# Patient Record
Sex: Female | Born: 1965 | Race: White | Hispanic: No | Marital: Married | Smoking: Never smoker
Health system: Southern US, Community
[De-identification: ages and names within clinical notes are randomized; demographics above are authoritative.]

## PROBLEM LIST (undated history)

## (undated) ENCOUNTER — Emergency Department (HOSPITAL_BASED_OUTPATIENT_CLINIC_OR_DEPARTMENT_OTHER): Payer: BC Managed Care – PPO

## (undated) DIAGNOSIS — R1032 Left lower quadrant pain: Secondary | ICD-10-CM

## (undated) DIAGNOSIS — T7840XA Allergy, unspecified, initial encounter: Secondary | ICD-10-CM

## (undated) DIAGNOSIS — IMO0001 Reserved for inherently not codable concepts without codable children: Secondary | ICD-10-CM

## (undated) DIAGNOSIS — F419 Anxiety disorder, unspecified: Secondary | ICD-10-CM

## (undated) DIAGNOSIS — R896 Abnormal cytological findings in specimens from other organs, systems and tissues: Secondary | ICD-10-CM

## (undated) HISTORY — DX: Allergy, unspecified, initial encounter: T78.40XA

## (undated) HISTORY — DX: Left lower quadrant pain: R10.32

## (undated) HISTORY — DX: Reserved for inherently not codable concepts without codable children: IMO0001

## (undated) HISTORY — PX: NO PAST SURGERIES: SHX2092

## (undated) HISTORY — DX: Anxiety disorder, unspecified: F41.9

## (undated) HISTORY — DX: Abnormal cytological findings in specimens from other organs, systems and tissues: R89.6

---

## 1997-11-27 ENCOUNTER — Inpatient Hospital Stay (HOSPITAL_COMMUNITY): Admission: AD | Admit: 1997-11-27 | Discharge: 1997-11-29 | Payer: Self-pay | Admitting: Obstetrics and Gynecology

## 1997-12-07 ENCOUNTER — Encounter (HOSPITAL_COMMUNITY): Admission: RE | Admit: 1997-12-07 | Discharge: 1998-03-07 | Payer: Self-pay | Admitting: Unknown Physician Specialty

## 1998-01-09 ENCOUNTER — Other Ambulatory Visit: Admission: RE | Admit: 1998-01-09 | Discharge: 1998-01-09 | Payer: Self-pay | Admitting: Obstetrics and Gynecology

## 1998-04-12 ENCOUNTER — Other Ambulatory Visit: Admission: RE | Admit: 1998-04-12 | Discharge: 1998-04-12 | Payer: Self-pay | Admitting: Obstetrics and Gynecology

## 1999-02-15 ENCOUNTER — Other Ambulatory Visit: Admission: RE | Admit: 1999-02-15 | Discharge: 1999-02-15 | Payer: Self-pay | Admitting: Obstetrics and Gynecology

## 1999-03-20 ENCOUNTER — Encounter: Admission: RE | Admit: 1999-03-20 | Discharge: 1999-06-18 | Payer: Self-pay | Admitting: Obstetrics and Gynecology

## 1999-08-05 ENCOUNTER — Encounter: Admission: RE | Admit: 1999-08-05 | Discharge: 1999-11-03 | Payer: Self-pay | Admitting: Obstetrics and Gynecology

## 2000-10-20 ENCOUNTER — Emergency Department (HOSPITAL_COMMUNITY): Admission: EM | Admit: 2000-10-20 | Discharge: 2000-10-20 | Payer: Self-pay | Admitting: Emergency Medicine

## 2000-10-23 ENCOUNTER — Encounter (HOSPITAL_COMMUNITY): Admission: RE | Admit: 2000-10-23 | Discharge: 2001-01-21 | Payer: Self-pay | Admitting: Emergency Medicine

## 2001-12-13 ENCOUNTER — Other Ambulatory Visit: Admission: RE | Admit: 2001-12-13 | Discharge: 2001-12-13 | Payer: Self-pay | Admitting: Obstetrics and Gynecology

## 2003-03-16 ENCOUNTER — Other Ambulatory Visit: Admission: RE | Admit: 2003-03-16 | Discharge: 2003-03-16 | Payer: Self-pay | Admitting: Obstetrics and Gynecology

## 2004-05-20 ENCOUNTER — Other Ambulatory Visit: Admission: RE | Admit: 2004-05-20 | Discharge: 2004-05-20 | Payer: Self-pay | Admitting: Obstetrics and Gynecology

## 2004-05-22 ENCOUNTER — Encounter: Admission: RE | Admit: 2004-05-22 | Discharge: 2004-05-22 | Payer: Self-pay | Admitting: Obstetrics and Gynecology

## 2005-05-22 ENCOUNTER — Other Ambulatory Visit: Admission: RE | Admit: 2005-05-22 | Discharge: 2005-05-22 | Payer: Self-pay | Admitting: Obstetrics and Gynecology

## 2005-06-16 ENCOUNTER — Encounter: Admission: RE | Admit: 2005-06-16 | Discharge: 2005-06-16 | Payer: Self-pay | Admitting: Obstetrics and Gynecology

## 2007-05-19 ENCOUNTER — Encounter: Admission: RE | Admit: 2007-05-19 | Discharge: 2007-05-19 | Payer: Self-pay | Admitting: Obstetrics and Gynecology

## 2008-05-22 ENCOUNTER — Encounter: Admission: RE | Admit: 2008-05-22 | Discharge: 2008-05-22 | Payer: Self-pay | Admitting: Obstetrics and Gynecology

## 2009-05-24 ENCOUNTER — Encounter: Admission: RE | Admit: 2009-05-24 | Discharge: 2009-05-24 | Payer: Self-pay | Admitting: Obstetrics and Gynecology

## 2010-04-29 ENCOUNTER — Other Ambulatory Visit: Payer: Self-pay | Admitting: Obstetrics and Gynecology

## 2010-04-29 DIAGNOSIS — Z1231 Encounter for screening mammogram for malignant neoplasm of breast: Secondary | ICD-10-CM

## 2010-05-28 ENCOUNTER — Ambulatory Visit
Admission: RE | Admit: 2010-05-28 | Discharge: 2010-05-28 | Disposition: A | Discharge status: Home / Self Care | Payer: BC Managed Care – PPO | Source: Ambulatory Visit | Attending: Obstetrics and Gynecology | Admitting: Obstetrics and Gynecology

## 2010-05-28 DIAGNOSIS — Z1231 Encounter for screening mammogram for malignant neoplasm of breast: Secondary | ICD-10-CM

## 2010-05-31 ENCOUNTER — Other Ambulatory Visit: Payer: Self-pay | Admitting: Obstetrics and Gynecology

## 2010-05-31 DIAGNOSIS — R928 Other abnormal and inconclusive findings on diagnostic imaging of breast: Secondary | ICD-10-CM

## 2010-06-04 ENCOUNTER — Ambulatory Visit
Admission: RE | Admit: 2010-06-04 | Discharge: 2010-06-04 | Disposition: A | Discharge status: Home / Self Care | Payer: BC Managed Care – PPO | Source: Ambulatory Visit | Attending: Obstetrics and Gynecology | Admitting: Obstetrics and Gynecology

## 2010-06-04 DIAGNOSIS — R928 Other abnormal and inconclusive findings on diagnostic imaging of breast: Secondary | ICD-10-CM

## 2011-05-05 ENCOUNTER — Other Ambulatory Visit: Payer: Self-pay | Admitting: Family Medicine

## 2011-05-06 ENCOUNTER — Other Ambulatory Visit: Payer: Self-pay | Admitting: Obstetrics and Gynecology

## 2011-05-06 DIAGNOSIS — Z1231 Encounter for screening mammogram for malignant neoplasm of breast: Secondary | ICD-10-CM

## 2011-06-10 ENCOUNTER — Ambulatory Visit
Admission: RE | Admit: 2011-06-10 | Discharge: 2011-06-10 | Disposition: A | Discharge status: Home / Self Care | Payer: BC Managed Care – PPO | Source: Ambulatory Visit | Attending: Obstetrics and Gynecology | Admitting: Obstetrics and Gynecology

## 2011-06-10 DIAGNOSIS — Z1231 Encounter for screening mammogram for malignant neoplasm of breast: Secondary | ICD-10-CM

## 2011-07-11 DIAGNOSIS — IMO0001 Reserved for inherently not codable concepts without codable children: Secondary | ICD-10-CM | POA: Insufficient documentation

## 2011-07-17 ENCOUNTER — Ambulatory Visit: Payer: Self-pay | Admitting: Obstetrics and Gynecology

## 2011-08-14 ENCOUNTER — Other Ambulatory Visit: Payer: Self-pay | Admitting: Obstetrics and Gynecology

## 2011-08-14 MED ORDER — ETONOGESTREL-ETHINYL ESTRADIOL 0.12-0.015 MG/24HR VA RING: VAGINAL_RING | VAGINAL | Status: DC

## 2011-08-14 NOTE — Telephone Encounter (Signed)
Laura/SR pt. °

## 2011-08-14 NOTE — Telephone Encounter (Signed)
Refill of nuvaring #1 sent to [facility] to last until AEX in July.  Pt notified.  ld

## 2011-08-27 ENCOUNTER — Ambulatory Visit: Payer: Self-pay | Admitting: Obstetrics and Gynecology

## 2011-09-08 ENCOUNTER — Encounter: Payer: Self-pay | Admitting: Obstetrics and Gynecology

## 2011-09-08 ENCOUNTER — Ambulatory Visit (INDEPENDENT_AMBULATORY_CARE_PROVIDER_SITE_OTHER): Payer: BC Managed Care – PPO | Admitting: Obstetrics and Gynecology

## 2011-09-08 VITALS — BP 120/74 | Ht 65.0 in | Wt 197.0 lb

## 2011-09-08 DIAGNOSIS — Z124 Encounter for screening for malignant neoplasm of cervix: Secondary | ICD-10-CM

## 2011-09-08 DIAGNOSIS — Z01419 Encounter for gynecological examination (general) (routine) without abnormal findings: Secondary | ICD-10-CM

## 2011-09-08 MED ORDER — ETONOGESTREL-ETHINYL ESTRADIOL 0.12-0.015 MG/24HR VA RING: VAGINAL_RING | VAGINAL | Status: DC

## 2011-09-08 NOTE — Progress Notes (Signed)
The patient reports:normal menses, no abnormal bleeding, pelvic pain or discharge  Contraception:NuvaRing vaginal inserts  Last mammogram: was normal April 2013 Last pap: was normal June  2012  GC/Chlamydia cultures offered: declined HIV/RPR/HbsAg offered:  declined HSV 1 and 2 glycoprotein offered: declined  Menstrual cycle regular and monthly: Yes Menstrual flow normal: Yes very light   Urinary symptoms: none Normal bowel movements: Yes Reports abuse at home: No  Subjective:    Marissa Marshall is a 46 y.o. female, G3P2, who presents for an annual exam. Nuvaring: satisfied    History   Social History  . Marital Status: Married    Spouse Name: N/A    Number of Children: N/A  . Years of Education: N/A   Social History Main Topics  . Smoking status: Never Smoker   . Smokeless tobacco: Never Used  . Alcohol Use: No  . Drug Use: No  . Sexually Active: Yes    Birth Control/ Protection: Inserts     Nuvaring   Other Topics Concern  . None   Social History Narrative  . None    Menstrual cycle:   LMP: Patient's last menstrual period was 08/15/2011.           Cycle: normal and light  The following portions of the patient's history were reviewed and updated as appropriate: allergies, current medications, past family history, past medical history, past social history, past surgical history and problem list.  Review of Systems Pertinent items are noted in HPI. Breast:Negative for breast lump,nipple discharge or nipple retraction Gastrointestinal: Negative for abdominal pain, change in bowel habits or rectal bleeding Urinary:negative   Objective:    BP 120/74  Ht 5\' 5"  (1.651 m)  Wt 197 lb (89.359 kg)  BMI 32.78 kg/m2  LMP 08/15/2011    Weight:  Wt Readings from Last 1 Encounters:  09/08/11 197 lb (89.359 kg)          BMI: Body mass index is 32.78 kg/(m^2).  General Appearance: Alert, appropriate appearance for age. No acute distress HEENT: Grossly normal Neck /  Thyroid: Supple, no masses, nodes or enlargement Lungs: clear to auscultation bilaterally Back: No CVA tenderness Breast Exam: No masses or nodes.No dimpling, nipple retraction or discharge. Cardiovascular: Regular rate and rhythm. S1, S2, no murmur Gastrointestinal: Soft, non-tender, no masses or organomegaly Pelvic Exam: Vulva and vagina appear normal. Bimanual exam reveals normal uterus and adnexa. Rectovaginal: normal rectal, no masses Lymphatic Exam: Non-palpable nodes in neck, clavicular, axillary, or inguinal regions Skin: no rash or abnormalities Neurologic: Normal gait and speech, no tremor  Psychiatric: Alert and oriented, appropriate affect.     Assessment:    Normal gyn exam Contraceptive management    Plan:    mammogram pap smear return annually or prn STD screening: declined Contraception:Nuvaring      Marissa Marshall AMD

## 2011-09-10 LAB — PAP IG W/ RFLX HPV ASCU

## 2012-05-06 ENCOUNTER — Other Ambulatory Visit: Payer: Self-pay

## 2012-05-06 DIAGNOSIS — Z1231 Encounter for screening mammogram for malignant neoplasm of breast: Secondary | ICD-10-CM

## 2012-06-10 ENCOUNTER — Ambulatory Visit
Admission: RE | Admit: 2012-06-10 | Discharge: 2012-06-10 | Disposition: A | Discharge status: Home / Self Care | Payer: BC Managed Care – PPO | Source: Ambulatory Visit

## 2012-06-10 DIAGNOSIS — Z1231 Encounter for screening mammogram for malignant neoplasm of breast: Secondary | ICD-10-CM

## 2013-05-03 ENCOUNTER — Other Ambulatory Visit: Payer: Self-pay

## 2013-05-03 DIAGNOSIS — Z1231 Encounter for screening mammogram for malignant neoplasm of breast: Secondary | ICD-10-CM

## 2013-05-30 ENCOUNTER — Other Ambulatory Visit: Payer: Self-pay | Admitting: Family Medicine

## 2013-05-30 DIAGNOSIS — R1032 Left lower quadrant pain: Secondary | ICD-10-CM

## 2013-06-02 ENCOUNTER — Ambulatory Visit
Admission: RE | Admit: 2013-06-02 | Discharge: 2013-06-02 | Disposition: A | Discharge status: Home / Self Care | Payer: BC Managed Care – PPO | Source: Ambulatory Visit | Attending: Family Medicine | Admitting: Family Medicine

## 2013-06-02 DIAGNOSIS — R1032 Left lower quadrant pain: Secondary | ICD-10-CM

## 2013-06-02 MED ORDER — IOHEXOL 300 MG/ML  SOLN
100.0000 mL | Freq: Once | INTRAMUSCULAR | Status: AC | PRN
  Administered 2013-06-02: 100 mL via INTRAVENOUS

## 2013-06-13 ENCOUNTER — Ambulatory Visit
Admission: RE | Admit: 2013-06-13 | Discharge: 2013-06-13 | Disposition: A | Discharge status: Home / Self Care | Payer: BC Managed Care – PPO | Source: Ambulatory Visit

## 2013-06-13 DIAGNOSIS — Z1231 Encounter for screening mammogram for malignant neoplasm of breast: Secondary | ICD-10-CM

## 2013-09-23 ENCOUNTER — Encounter: Payer: Self-pay | Admitting: *Deleted

## 2013-09-23 ENCOUNTER — Encounter: Payer: Self-pay | Admitting: Cardiology

## 2013-09-23 DIAGNOSIS — T7840XA Allergy, unspecified, initial encounter: Secondary | ICD-10-CM | POA: Insufficient documentation

## 2014-01-02 ENCOUNTER — Encounter: Payer: Self-pay | Admitting: *Deleted

## 2014-05-09 ENCOUNTER — Other Ambulatory Visit: Payer: Self-pay

## 2014-05-09 DIAGNOSIS — Z1231 Encounter for screening mammogram for malignant neoplasm of breast: Secondary | ICD-10-CM

## 2014-06-20 ENCOUNTER — Encounter (INDEPENDENT_AMBULATORY_CARE_PROVIDER_SITE_OTHER): Payer: Self-pay

## 2014-06-20 ENCOUNTER — Ambulatory Visit
Admission: RE | Admit: 2014-06-20 | Discharge: 2014-06-20 | Disposition: A | Discharge status: Home / Self Care | Payer: BLUE CROSS/BLUE SHIELD | Source: Ambulatory Visit

## 2014-06-20 ENCOUNTER — Other Ambulatory Visit: Payer: Self-pay

## 2014-06-20 DIAGNOSIS — Z1231 Encounter for screening mammogram for malignant neoplasm of breast: Secondary | ICD-10-CM

## 2015-05-15 ENCOUNTER — Other Ambulatory Visit: Payer: Self-pay

## 2015-05-15 DIAGNOSIS — Z1231 Encounter for screening mammogram for malignant neoplasm of breast: Secondary | ICD-10-CM

## 2015-06-22 ENCOUNTER — Ambulatory Visit
Admission: RE | Admit: 2015-06-22 | Discharge: 2015-06-22 | Disposition: A | Discharge status: Home / Self Care | Payer: BLUE CROSS/BLUE SHIELD | Source: Ambulatory Visit

## 2015-06-22 DIAGNOSIS — Z1231 Encounter for screening mammogram for malignant neoplasm of breast: Secondary | ICD-10-CM

## 2016-03-06 DIAGNOSIS — T7840XA Allergy, unspecified, initial encounter: Secondary | ICD-10-CM | POA: Insufficient documentation

## 2016-05-20 ENCOUNTER — Other Ambulatory Visit: Payer: Self-pay | Admitting: Obstetrics and Gynecology

## 2016-05-20 DIAGNOSIS — Z1231 Encounter for screening mammogram for malignant neoplasm of breast: Secondary | ICD-10-CM

## 2016-07-02 ENCOUNTER — Ambulatory Visit
Admission: RE | Admit: 2016-07-02 | Discharge: 2016-07-02 | Disposition: A | Discharge status: Home / Self Care | Payer: PRIVATE HEALTH INSURANCE | Source: Ambulatory Visit | Attending: Obstetrics and Gynecology | Admitting: Obstetrics and Gynecology

## 2016-07-02 DIAGNOSIS — Z1231 Encounter for screening mammogram for malignant neoplasm of breast: Secondary | ICD-10-CM

## 2017-05-27 ENCOUNTER — Other Ambulatory Visit: Payer: Self-pay | Admitting: Obstetrics and Gynecology

## 2017-05-27 DIAGNOSIS — Z1231 Encounter for screening mammogram for malignant neoplasm of breast: Secondary | ICD-10-CM

## 2017-07-08 ENCOUNTER — Ambulatory Visit
Admission: RE | Admit: 2017-07-08 | Discharge: 2017-07-08 | Disposition: A | Discharge status: Home / Self Care | Payer: PRIVATE HEALTH INSURANCE | Source: Ambulatory Visit | Attending: Obstetrics and Gynecology | Admitting: Obstetrics and Gynecology

## 2017-07-08 DIAGNOSIS — Z1231 Encounter for screening mammogram for malignant neoplasm of breast: Secondary | ICD-10-CM

## 2017-09-30 DIAGNOSIS — H9311 Tinnitus, right ear: Secondary | ICD-10-CM | POA: Insufficient documentation

## 2017-09-30 DIAGNOSIS — H903 Sensorineural hearing loss, bilateral: Secondary | ICD-10-CM | POA: Insufficient documentation

## 2018-04-01 ENCOUNTER — Encounter: Payer: PRIVATE HEALTH INSURANCE | Admitting: Obstetrics & Gynecology

## 2018-06-04 ENCOUNTER — Other Ambulatory Visit: Payer: Self-pay | Admitting: Family Medicine

## 2018-06-04 DIAGNOSIS — Z1231 Encounter for screening mammogram for malignant neoplasm of breast: Secondary | ICD-10-CM

## 2018-08-04 ENCOUNTER — Ambulatory Visit: Payer: PRIVATE HEALTH INSURANCE

## 2018-09-09 ENCOUNTER — Ambulatory Visit
Admission: RE | Admit: 2018-09-09 | Discharge: 2018-09-09 | Disposition: A | Discharge status: Home / Self Care | Payer: PRIVATE HEALTH INSURANCE | Source: Ambulatory Visit | Attending: Family Medicine | Admitting: Family Medicine

## 2018-09-09 DIAGNOSIS — Z1231 Encounter for screening mammogram for malignant neoplasm of breast: Secondary | ICD-10-CM

## 2019-08-03 ENCOUNTER — Other Ambulatory Visit: Payer: Self-pay | Admitting: Family Medicine

## 2019-08-03 DIAGNOSIS — Z1231 Encounter for screening mammogram for malignant neoplasm of breast: Secondary | ICD-10-CM

## 2019-09-14 ENCOUNTER — Ambulatory Visit
Admission: RE | Admit: 2019-09-14 | Discharge: 2019-09-14 | Disposition: A | Discharge status: Home / Self Care | Payer: PRIVATE HEALTH INSURANCE | Source: Ambulatory Visit | Attending: Family Medicine | Admitting: Family Medicine

## 2019-09-14 ENCOUNTER — Other Ambulatory Visit: Payer: Self-pay

## 2019-09-14 DIAGNOSIS — Z1231 Encounter for screening mammogram for malignant neoplasm of breast: Secondary | ICD-10-CM

## 2020-01-10 ENCOUNTER — Other Ambulatory Visit: Payer: Self-pay | Admitting: Family Medicine

## 2020-01-10 DIAGNOSIS — R1012 Left upper quadrant pain: Secondary | ICD-10-CM

## 2020-01-19 ENCOUNTER — Ambulatory Visit
Admission: RE | Admit: 2020-01-19 | Discharge: 2020-01-19 | Disposition: A | Discharge status: Home / Self Care | Payer: PRIVATE HEALTH INSURANCE | Source: Ambulatory Visit | Attending: Family Medicine | Admitting: Family Medicine

## 2020-01-19 DIAGNOSIS — R1012 Left upper quadrant pain: Secondary | ICD-10-CM

## 2020-08-14 ENCOUNTER — Other Ambulatory Visit: Payer: Self-pay | Admitting: Family Medicine

## 2020-08-14 DIAGNOSIS — Z1231 Encounter for screening mammogram for malignant neoplasm of breast: Secondary | ICD-10-CM

## 2020-10-05 ENCOUNTER — Ambulatory Visit
Admission: RE | Admit: 2020-10-05 | Discharge: 2020-10-05 | Disposition: A | Discharge status: Home / Self Care | Payer: No Typology Code available for payment source | Source: Ambulatory Visit | Attending: Family Medicine | Admitting: Family Medicine

## 2020-10-05 ENCOUNTER — Other Ambulatory Visit: Payer: Self-pay

## 2020-10-05 DIAGNOSIS — Z1231 Encounter for screening mammogram for malignant neoplasm of breast: Secondary | ICD-10-CM

## 2021-01-22 ENCOUNTER — Ambulatory Visit: Payer: Self-pay | Admitting: Podiatry

## 2021-01-30 ENCOUNTER — Ambulatory Visit (INDEPENDENT_AMBULATORY_CARE_PROVIDER_SITE_OTHER): Payer: PRIVATE HEALTH INSURANCE

## 2021-01-30 ENCOUNTER — Other Ambulatory Visit: Payer: Self-pay

## 2021-01-30 ENCOUNTER — Ambulatory Visit (INDEPENDENT_AMBULATORY_CARE_PROVIDER_SITE_OTHER): Payer: Self-pay | Admitting: Podiatry

## 2021-01-30 ENCOUNTER — Encounter: Payer: Self-pay | Admitting: Podiatry

## 2021-01-30 DIAGNOSIS — M722 Plantar fascial fibromatosis: Secondary | ICD-10-CM

## 2021-01-30 MED ORDER — DICLOFENAC SODIUM 75 MG PO TBEC: 75.0000 mg | DELAYED_RELEASE_TABLET | Freq: Two times a day (BID) | ORAL | 2 refills | Status: DC

## 2021-01-30 MED ORDER — TRIAMCINOLONE ACETONIDE 10 MG/ML IJ SUSP
10.0000 mg | Freq: Once | INTRAMUSCULAR | Status: AC
  Administered 2021-01-30: 10 mg

## 2021-01-30 NOTE — Progress Notes (Signed)
Subjective:   Patient ID: Marissa Marshall, female   DOB: 55 y.o.   MRN: [id number]   HPI Patient presents stating her left heel has been very sore and's been going on for 3 months.  It bruises and she gets sharp pain that seems to come and go and is worse when she gets up or after periods of sitting   Review of Systems  All other systems reviewed and are negative.      Objective:  Physical Exam Vitals and nursing note reviewed.  Constitutional:      Appearance: She is well-developed.  Pulmonary:     Effort: Pulmonary effort is normal.  Musculoskeletal:        General: Normal range of motion.  Skin:    General: Skin is warm.  Neurological:     Mental Status: She is alert.    Neurovascular status intact muscle strength found to be adequate range of motion adequate with exquisite discomfort medial band left heel at the insertional point tendon into the calcaneus with fluid buildup around the insertional point.  Good digital perfusion well oriented x3     Assessment:  Acute plantar fasciitis left inflammation fluid medial band     Plan:  H&P reviewed condition went ahead today reviewed x-rays did sterile prep and injected the fascia left 3 mg Kenalog 5 mg Xylocaine and instructed on physical therapy stretching exercises along with shoe gear modification.  Reappoint 2 weeks to recheck  X-rays indicate that there is small spur no indications of stress fracture arthritis

## 2021-01-30 NOTE — Patient Instructions (Signed)

## 2021-02-14 ENCOUNTER — Encounter: Payer: Self-pay | Admitting: Podiatry

## 2021-02-14 ENCOUNTER — Other Ambulatory Visit: Payer: Self-pay

## 2021-02-14 ENCOUNTER — Ambulatory Visit (INDEPENDENT_AMBULATORY_CARE_PROVIDER_SITE_OTHER): Payer: Self-pay | Admitting: Podiatry

## 2021-02-14 DIAGNOSIS — M722 Plantar fascial fibromatosis: Secondary | ICD-10-CM | POA: Diagnosis not present

## 2021-02-14 NOTE — Progress Notes (Signed)
Subjective:   Patient ID: Marissa Marshall, female   DOB: 55 y.o.   MRN: [id number]   HPI Patient states that heel is about 80% better with only mild discomfort   ROS      Objective:  Physical Exam  Neurovascular status intact discomfort in the plantar aspect of the left heel that still present but has improved by about 80% with just mild discomfort and patient who states she also cannot take the oral anti-inflammatory     Assessment:  Acute Planter fasciitis left improved     Plan:  H&P reviewed Planter fasciitis and continued conservative treatment and shoe gear modifications.  If symptoms were to recur will probably require orthotics or other more aggressive treatment plan depending on how it is doing

## 2021-04-09 ENCOUNTER — Other Ambulatory Visit: Payer: Self-pay | Admitting: Family Medicine

## 2021-04-09 DIAGNOSIS — R1012 Left upper quadrant pain: Secondary | ICD-10-CM

## 2021-04-24 ENCOUNTER — Encounter: Payer: Self-pay | Admitting: Podiatry

## 2021-04-24 ENCOUNTER — Ambulatory Visit: Payer: Managed Care, Other (non HMO) | Admitting: Podiatry

## 2021-04-24 ENCOUNTER — Other Ambulatory Visit: Payer: Self-pay

## 2021-04-24 DIAGNOSIS — M722 Plantar fascial fibromatosis: Secondary | ICD-10-CM | POA: Diagnosis not present

## 2021-04-24 MED ORDER — TRIAMCINOLONE ACETONIDE 10 MG/ML IJ SUSP
10.0000 mg | Freq: Once | INTRAMUSCULAR | Status: AC
  Administered 2021-04-24: 10 mg

## 2021-04-24 NOTE — Progress Notes (Signed)
Subjective:   Patient ID: Marissa Marshall, female   DOB: 56 y.o.   MRN: [id number]   HPI Patient presents stating that she has had reoccurrence of significant pain left heel and it is worse when she gets up in the morning after periods of sitting   ROS      Objective:  Physical Exam  Acute inflammation of the left plantar fascia with fluid buildup at insertion tendon calcaneus     Assessment:  Acute plantar fasciitis left with inflammation     Plan:  Sterile prep went ahead and injected the plantar fascia insertion 3 mg Kenalog 5 mg Xylocaine and dispensed night splint and ice packs with instructions on usage and patient will be seen back depending on response and may require more aggressive treatment plan

## 2021-04-26 ENCOUNTER — Ambulatory Visit
Admission: RE | Admit: 2021-04-26 | Discharge: 2021-04-26 | Disposition: A | Discharge status: Home / Self Care | Payer: Managed Care, Other (non HMO) | Source: Ambulatory Visit | Attending: Family Medicine | Admitting: Family Medicine

## 2021-04-26 DIAGNOSIS — R1012 Left upper quadrant pain: Secondary | ICD-10-CM

## 2021-04-26 MED ORDER — IOPAMIDOL (ISOVUE-370) INJECTION 76%
100.0000 mL | Freq: Once | INTRAVENOUS | Status: AC | PRN
  Administered 2021-04-26: 100 mL via INTRAVENOUS

## 2021-05-17 IMAGING — MG DIGITAL SCREENING BILAT W/ CAD
4 series · 4 of 4 positions shown · non-contrast
Comparison: Previous exam(s).

CLINICAL DATA: Screening.

EXAM:
DIGITAL SCREENING BILATERAL MAMMOGRAM WITH CAD

[R CC]
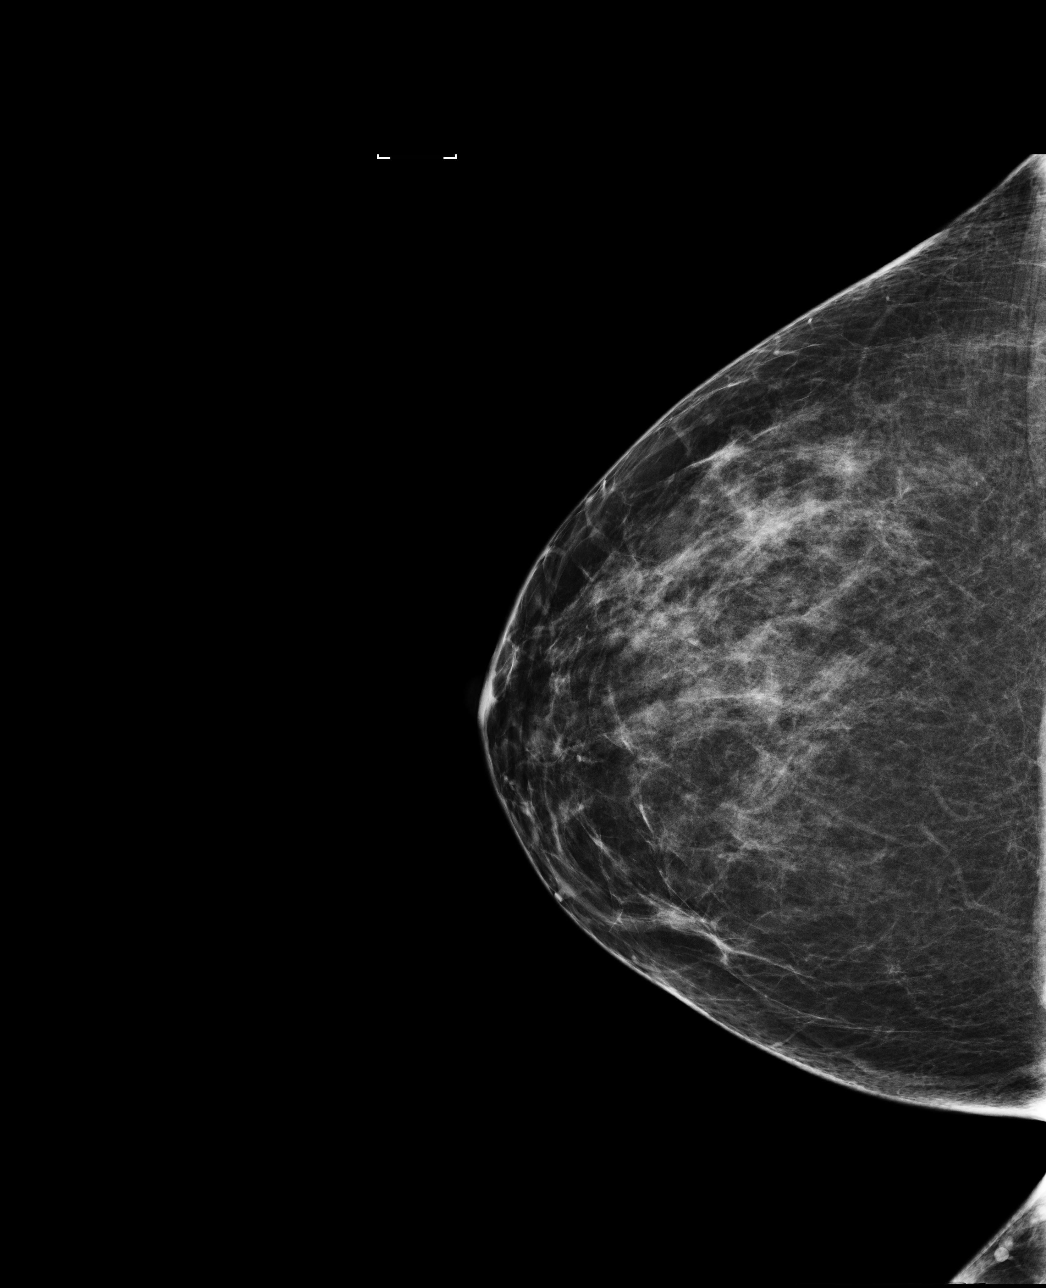

[L CC]
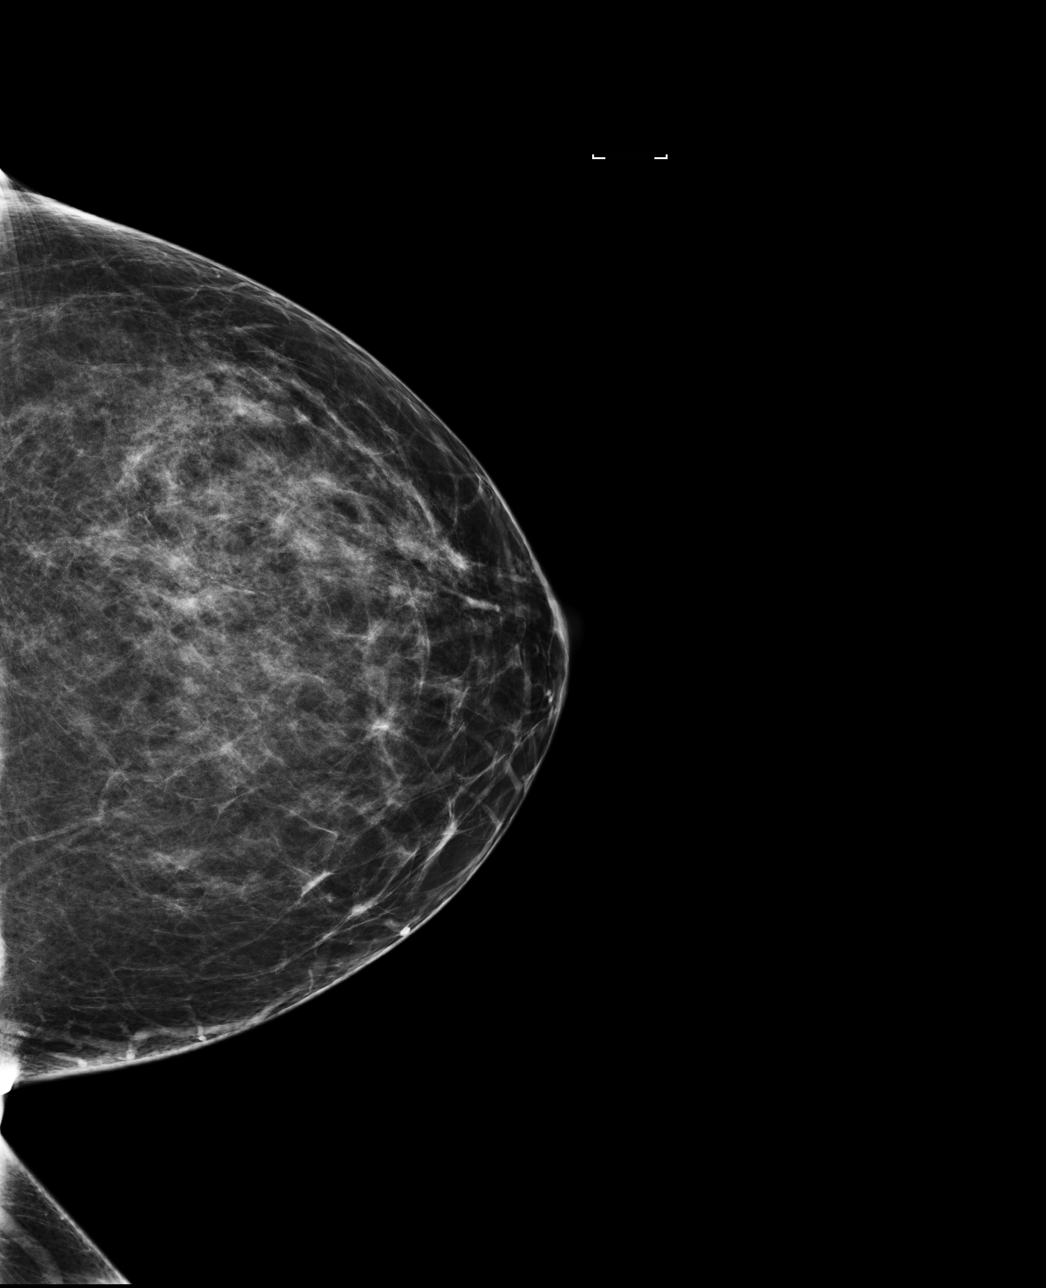

[R MLO]
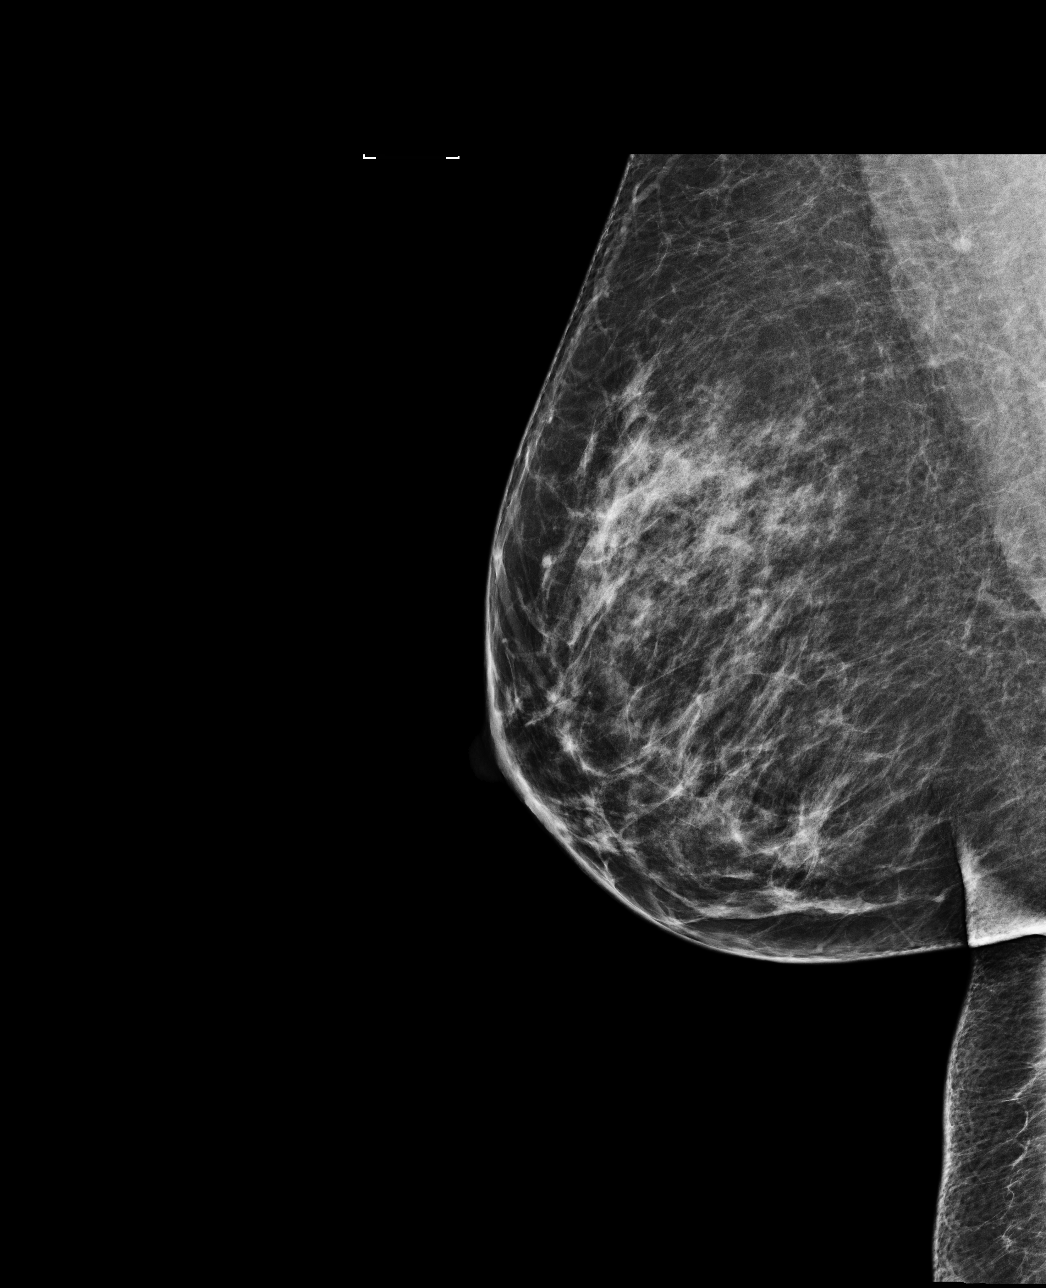

[L MLO]
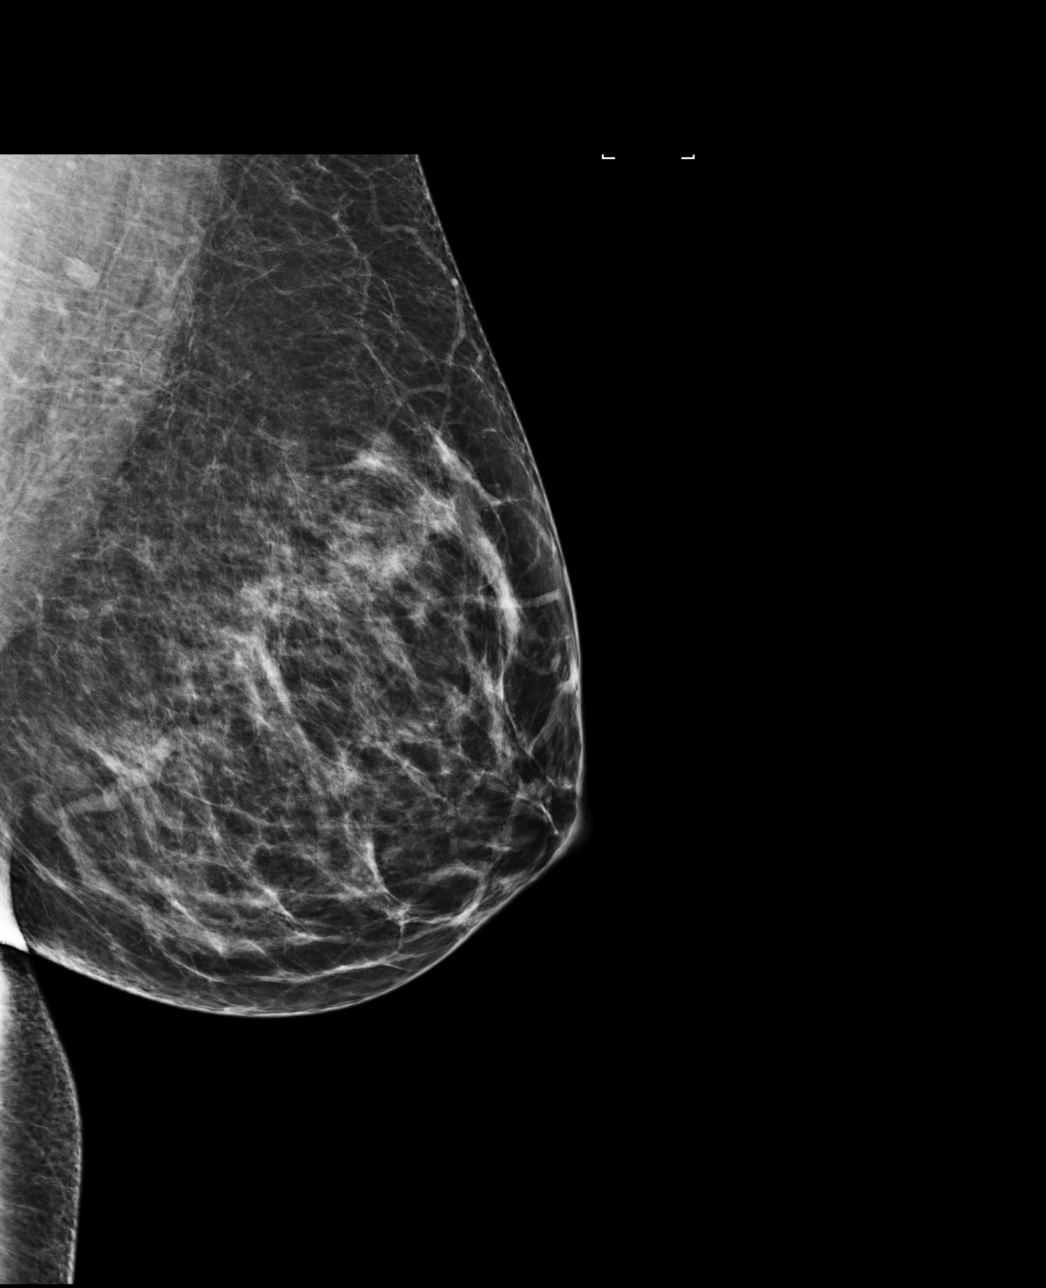

[4 of 4 positions shown; findings below may reference images not displayed]

ACR Breast Density Category b: There are scattered areas of
fibroglandular density.
FINDINGS: There are no findings suspicious for malignancy. Images were
processed with CAD.
IMPRESSION: No mammographic evidence of malignancy. A result letter of this
screening mammogram will be mailed directly to the patient.

RECOMMENDATION:
Screening mammogram in one year. (Code:AS-G-LCT)

BI-RADS CATEGORY  1: Negative.

## 2021-06-12 ENCOUNTER — Encounter: Payer: Self-pay | Admitting: Podiatry

## 2021-06-12 ENCOUNTER — Ambulatory Visit (INDEPENDENT_AMBULATORY_CARE_PROVIDER_SITE_OTHER): Payer: Managed Care, Other (non HMO) | Admitting: Podiatry

## 2021-06-12 DIAGNOSIS — M722 Plantar fascial fibromatosis: Secondary | ICD-10-CM | POA: Diagnosis not present

## 2021-06-12 MED ORDER — TRIAMCINOLONE ACETONIDE 10 MG/ML IJ SUSP
10.0000 mg | Freq: Once | INTRAMUSCULAR | Status: AC
  Administered 2021-06-12: 10 mg

## 2021-06-12 NOTE — Progress Notes (Signed)
Subjective:  ? ?Patient ID: Marissa Marshall, female   DOB: 56 y.o.   MRN: [id number]  ? ?HPI ?Patient continues to experience severe discomfort in the plantar aspect left heel admitting she has not been using her night splint during the evening but has been sleeping in it recently and she went to [place] and it flared up after that ? ? ?ROS ? ? ?   ?Objective:  ?Physical Exam  ?Neurovascular status intact acute discomfort plantar fascial left at the insertional point of the tendon into the calcaneus ? ?   ?Assessment:  ?Acute Planter fasciitis left that so far has failed to respond conservatively ? ?   ?Plan:  ?H&P reviewed condition and we are going to try complete immobilization with air fracture walker which was dispensed today with all instructions on usage.  I then went ahead and I did do 1 more injection of the plantar fascia at insertion 3 mg dexamethasone Kenalog and I advised on reduced activity and patient will be seen back 6 weeks may require shockwave PRP injection or in the future possible surgical intervention ? ? ?   ? ? ?

## 2021-07-02 ENCOUNTER — Encounter: Payer: Self-pay | Admitting: Podiatry

## 2021-07-08 DIAGNOSIS — M0579 Rheumatoid arthritis with rheumatoid factor of multiple sites without organ or systems involvement: Secondary | ICD-10-CM | POA: Diagnosis not present

## 2021-07-19 ENCOUNTER — Ambulatory Visit (INDEPENDENT_AMBULATORY_CARE_PROVIDER_SITE_OTHER): Payer: BC Managed Care – PPO | Admitting: Podiatry

## 2021-07-19 ENCOUNTER — Encounter: Payer: Self-pay | Admitting: Podiatry

## 2021-07-19 DIAGNOSIS — M722 Plantar fascial fibromatosis: Secondary | ICD-10-CM

## 2021-07-19 NOTE — Progress Notes (Signed)
Subjective:   Patient ID: Marissa Marshall, female   DOB: 56 y.o.   MRN: [id number]   HPI Patient presents stating she has had significant reoccurrence of Planter fasciitis left and states that it came back full throttle a week ago and does okay in the boot but the minute she takes it off it is bad again   ROS      Objective:  Physical Exam  Vascular status intact severe discomfort medial fascial band left at the insertional point of the tendon calcaneus     Assessment:  Planter fasciitis that so far has not improved with aggressive conservative care including complete immobilization     Plan:  Reviewed condition and at this point we discussed more aggressive treatment options including the thought of endoscopic surgery versus shockwave.  I discussed both options and at this point she is going to opt for shockwave but talk to her husband and may decide on surgery.  She is due to go to [place] in August so we are desperately trying to get this condition under control

## 2021-07-24 ENCOUNTER — Ambulatory Visit: Payer: Managed Care, Other (non HMO) | Admitting: Podiatry

## 2021-08-02 ENCOUNTER — Encounter: Payer: Self-pay | Admitting: Podiatry

## 2021-08-02 ENCOUNTER — Ambulatory Visit: Payer: BC Managed Care – PPO | Admitting: Podiatry

## 2021-08-02 ENCOUNTER — Telehealth: Payer: Self-pay | Admitting: Urology

## 2021-08-02 DIAGNOSIS — M722 Plantar fascial fibromatosis: Secondary | ICD-10-CM

## 2021-08-02 NOTE — Telephone Encounter (Signed)
DOS - 08/06/21  EPF LEFT --- 83662  [employer] EFFECTIVE DATE - 07/01/21  PLAN DEDUCTIBLE - $1,500.00 W/ $1,500.00 REMAINING  OUT OF POCKET - $4,500.00 W/ $4,460.00 REMAINING COINSURANCE - 20% COPAY - $0.00   NO PRIOR AUTH REQUIRED

## 2021-08-04 NOTE — Progress Notes (Signed)
Subjective:   Patient ID: Marissa Marshall, female   DOB: 56 y.o.   MRN: [id number]   HPI Patient presents stating that her heel is not improving and she is ready for surgery.  Patient states the pain is severe when she gets up in the morning after periods of sitting   ROS      Objective:  Physical Exam  Neurovascular status intact exquisite discomfort plantar aspect left heel at the insertional point tendon calcaneus inflammation fluid around the medial band     Assessment:  Acute plantar fasciitis left failure to respond to numerous conservative treatments     Plan:  H&P reviewed condition recommended correction of deformity and allowed her to read consent form going over alternative treatments complications.  Explained that with this procedure there can be some pain in the arch and other discomforts due to the longstanding discomfort and change in gait that she has had in that all risks exist and she is willing to accept this signed consent form.  Air fracture walker will be utilized education given total recovery can take 6 months to 1 year.  Patient scheduled outpatient surgery all questions answered today

## 2021-08-05 MED ORDER — HYDROCODONE-ACETAMINOPHEN 10-325 MG PO TABS: 1.0000 | ORAL_TABLET | Freq: Three times a day (TID) | ORAL | 0 refills | Status: AC | PRN

## 2021-08-05 NOTE — Addendum Note (Signed)
Addended by: Lenn Sink on: 08/05/2021 01:23 PM   Modules accepted: Orders

## 2021-08-06 ENCOUNTER — Encounter: Payer: Self-pay | Admitting: Podiatry

## 2021-08-06 DIAGNOSIS — H269 Unspecified cataract: Secondary | ICD-10-CM | POA: Diagnosis not present

## 2021-08-06 DIAGNOSIS — E119 Type 2 diabetes mellitus without complications: Secondary | ICD-10-CM | POA: Diagnosis not present

## 2021-08-06 DIAGNOSIS — M722 Plantar fascial fibromatosis: Secondary | ICD-10-CM | POA: Diagnosis not present

## 2021-08-09 ENCOUNTER — Ambulatory Visit: Payer: BC Managed Care – PPO

## 2021-08-09 ENCOUNTER — Ambulatory Visit: Payer: BC Managed Care – PPO | Admitting: Podiatry

## 2021-08-09 ENCOUNTER — Telehealth: Payer: Self-pay | Admitting: *Deleted

## 2021-08-09 DIAGNOSIS — M722 Plantar fascial fibromatosis: Secondary | ICD-10-CM

## 2021-08-09 NOTE — Telephone Encounter (Signed)
Patient is calling because her dressing is rubbing against the heel of the boot, causing a lot of pain w/ walking. Should it be evaluated and redressed.Please advise.

## 2021-08-09 NOTE — Progress Notes (Signed)
Subjective:   Patient ID: Marissa Marshall, female   DOB: 56 y.o.   MRN: [id number]   HPI Patient presents with significant plantar fascial discomfort left after having surgery and it could be the dressing   ROS      Objective:  Physical Exam  Neurovascular status intact negative Bevelyn Buckles' sign was noted with patient's left foot showing no issues or pathology I did remove about three quarters of the dressing did not note any problem reapplied dressing     Assessment:  No indications of distress     Plan:  New dressing applied patient tolerated this well will be seen back regular appointment Monday or earlier if needed

## 2021-08-09 NOTE — Telephone Encounter (Signed)
Patient is coming in today @ 12:15, confirmed.

## 2021-08-12 ENCOUNTER — Ambulatory Visit (INDEPENDENT_AMBULATORY_CARE_PROVIDER_SITE_OTHER): Payer: BC Managed Care – PPO | Admitting: Podiatry

## 2021-08-12 ENCOUNTER — Encounter: Payer: Self-pay | Admitting: Podiatry

## 2021-08-12 DIAGNOSIS — M722 Plantar fascial fibromatosis: Secondary | ICD-10-CM

## 2021-08-12 NOTE — Progress Notes (Signed)
Subjective:   Patient ID: Marissa Marshall, female   DOB: 56 y.o.   MRN: [id number]   HPI Patient states doing well with surgery   ROS      Objective:  Physical Exam  Neurovascular status intact negative Denna Haggard' sign noted wound edges well coapted mild discomfort in the arch where the tendon retracted but overall doing excellent with stitches intact     Assessment:  Doing well post endoscopic surgery left     Plan:  Revealed and reviewed continued boot usage along with night splint usage and reapplied Ace wrap and patient will reappoint 2 weeks for stitch removal and then may reduce slowly return to soft shoe gear with education today and will not need to see me after that 2-week appointment.  I went ahead today and I did dispense a compression stocking also that she will start in the next several weeks

## 2021-08-26 ENCOUNTER — Ambulatory Visit (INDEPENDENT_AMBULATORY_CARE_PROVIDER_SITE_OTHER): Payer: BC Managed Care – PPO | Admitting: *Deleted

## 2021-08-26 DIAGNOSIS — M722 Plantar fascial fibromatosis: Secondary | ICD-10-CM

## 2021-08-26 DIAGNOSIS — Z9889 Other specified postprocedural states: Secondary | ICD-10-CM

## 2021-08-26 NOTE — Progress Notes (Signed)
Patient presents for post op visit and suture removal today.   POV #2 - DOS 08/06/21 EPF LEFT   She states she has been feeling good. She does sometimes have a twinge of pain that feels like a rubberband snapping in the heel. She presents in her CAM boot today.    Sutures removed. No signs of infection. Slight tenderness and swelling medial incision.    She will be returning to work. Advised to wean herself out of the boot, but continue the night splint nightly for the next 4 weeks.   She will follow up in 4 weeks with Dr. Charlsie Merles for re-evaluation.

## 2021-08-28 ENCOUNTER — Other Ambulatory Visit: Payer: BC Managed Care – PPO

## 2021-08-28 DIAGNOSIS — R739 Hyperglycemia, unspecified: Secondary | ICD-10-CM | POA: Diagnosis not present

## 2021-08-28 DIAGNOSIS — R5383 Other fatigue: Secondary | ICD-10-CM | POA: Diagnosis not present

## 2021-08-28 DIAGNOSIS — R632 Polyphagia: Secondary | ICD-10-CM | POA: Diagnosis not present

## 2021-08-28 DIAGNOSIS — Z1331 Encounter for screening for depression: Secondary | ICD-10-CM | POA: Diagnosis not present

## 2021-08-28 DIAGNOSIS — R0602 Shortness of breath: Secondary | ICD-10-CM | POA: Diagnosis not present

## 2021-09-06 ENCOUNTER — Other Ambulatory Visit: Payer: Self-pay | Admitting: Family Medicine

## 2021-09-06 DIAGNOSIS — Z1231 Encounter for screening mammogram for malignant neoplasm of breast: Secondary | ICD-10-CM

## 2021-09-10 ENCOUNTER — Telehealth: Payer: Self-pay | Admitting: *Deleted

## 2021-09-10 NOTE — Telephone Encounter (Signed)
Patient is calling to request PT, her post surgery foot is still tender,hurts to walk on.Please advise

## 2021-09-11 NOTE — Telephone Encounter (Signed)
Sent to [facility]. They should contact her this week

## 2021-09-11 NOTE — Telephone Encounter (Signed)
Notified patient.

## 2021-09-13 ENCOUNTER — Telehealth: Payer: Self-pay | Admitting: *Deleted

## 2021-09-13 DIAGNOSIS — D2261 Melanocytic nevi of right upper limb, including shoulder: Secondary | ICD-10-CM | POA: Diagnosis not present

## 2021-09-13 DIAGNOSIS — Z85828 Personal history of other malignant neoplasm of skin: Secondary | ICD-10-CM | POA: Diagnosis not present

## 2021-09-13 DIAGNOSIS — D2262 Melanocytic nevi of left upper limb, including shoulder: Secondary | ICD-10-CM | POA: Diagnosis not present

## 2021-09-13 DIAGNOSIS — L720 Epidermal cyst: Secondary | ICD-10-CM | POA: Diagnosis not present

## 2021-09-13 NOTE — Telephone Encounter (Signed)
Pt states she has not heard from [facility] as of yet. I gave her their phone number to contact them directly. She states that her foot is still tender and she has worn her sneakers for 3 hours today but she had to go back to wearing the boot due to the tenderness. She hope to get in with PT soon prior to her vacation in 3 wks.

## 2021-09-13 NOTE — Telephone Encounter (Signed)
Refaxed PT referral to [facility] today, confirmation received, notified patient that it has been resent as well.

## 2021-09-13 NOTE — Telephone Encounter (Signed)
Refaxed the referral-09/13/21, patient notified.

## 2021-09-13 NOTE — Telephone Encounter (Signed)
I sent request in

## 2021-09-18 ENCOUNTER — Other Ambulatory Visit (HOSPITAL_COMMUNITY): Payer: Self-pay

## 2021-09-18 DIAGNOSIS — M0579 Rheumatoid arthritis with rheumatoid factor of multiple sites without organ or systems involvement: Secondary | ICD-10-CM | POA: Diagnosis not present

## 2021-09-18 DIAGNOSIS — E8881 Metabolic syndrome: Secondary | ICD-10-CM | POA: Diagnosis not present

## 2021-09-18 DIAGNOSIS — R632 Polyphagia: Secondary | ICD-10-CM | POA: Diagnosis not present

## 2021-09-18 MED ORDER — WEGOVY 0.25 MG/0.5ML ~~LOC~~ SOAJ
SUBCUTANEOUS | 2 refills | Status: DC
  Filled 2021-09-18: qty 2, 28d supply, fill #0
  Filled 2021-10-14: qty 2, 28d supply, fill #1

## 2021-09-18 MED ORDER — SCOPOLAMINE 1 MG/3DAYS TD PT72
MEDICATED_PATCH | TRANSDERMAL | 0 refills | Status: DC
  Filled 2021-09-18: qty 9, 27d supply, fill #0

## 2021-09-19 ENCOUNTER — Other Ambulatory Visit (HOSPITAL_COMMUNITY): Payer: Self-pay

## 2021-09-19 DIAGNOSIS — M62572 Muscle wasting and atrophy, not elsewhere classified, left ankle and foot: Secondary | ICD-10-CM | POA: Diagnosis not present

## 2021-09-19 DIAGNOSIS — R269 Unspecified abnormalities of gait and mobility: Secondary | ICD-10-CM | POA: Diagnosis not present

## 2021-09-19 DIAGNOSIS — M25472 Effusion, left ankle: Secondary | ICD-10-CM | POA: Diagnosis not present

## 2021-09-19 DIAGNOSIS — M79672 Pain in left foot: Secondary | ICD-10-CM | POA: Diagnosis not present

## 2021-09-19 MED ORDER — SAXENDA 18 MG/3ML ~~LOC~~ SOPN
PEN_INJECTOR | SUBCUTANEOUS | 0 refills | Status: DC
  Filled 2021-09-19: qty 15, 30d supply, fill #0

## 2021-09-21 IMAGING — US US ABDOMEN COMPLETE
1 series · 14 of 25 positions shown · non-contrast
Comparison: CT, CT, 06/02/2013.

CLINICAL DATA: Increasing left upper quadrant pain, burning and
sensation of fullness.

EXAM:
ABDOMEN ULTRASOUND COMPLETE

[Series 1: us abdomen complete · 0.19mm/px · 14 of 97 slices shown]
[im 1/97]
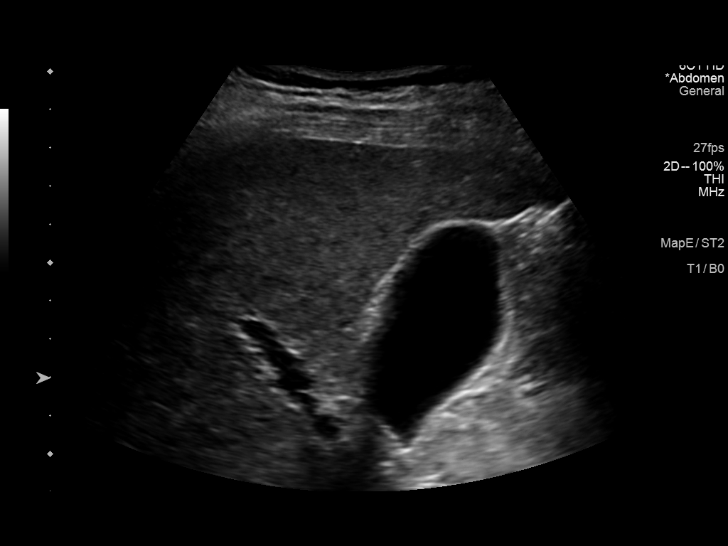
[im 9/97]
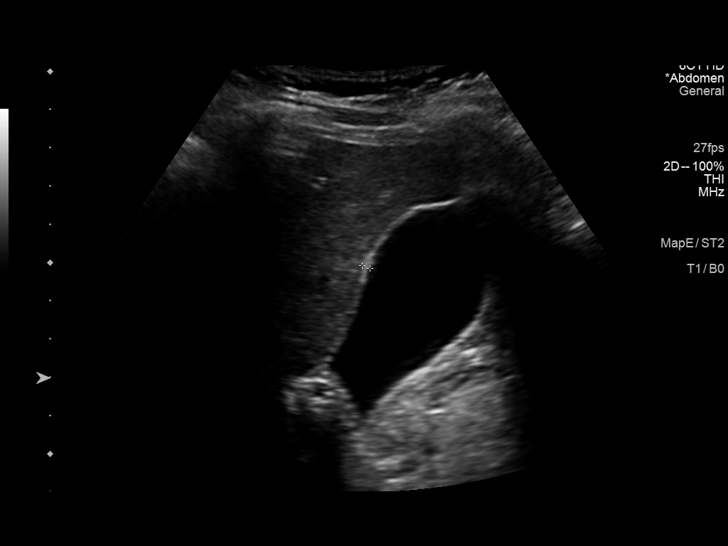
[im 17/97]
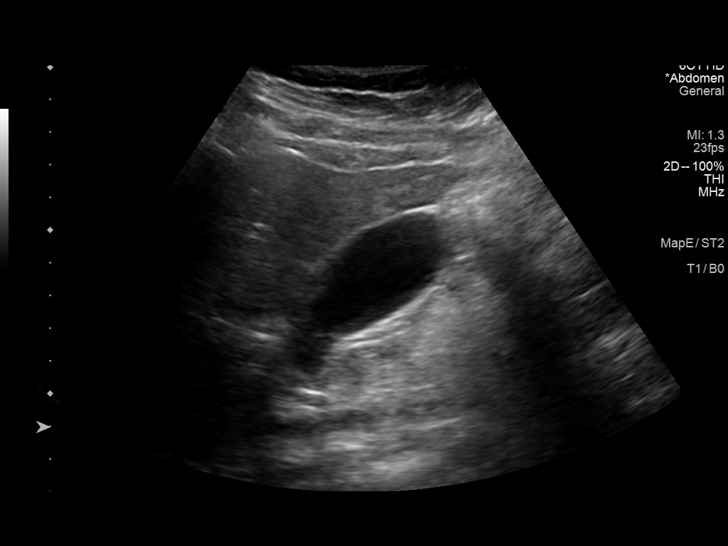
[im 25/97]
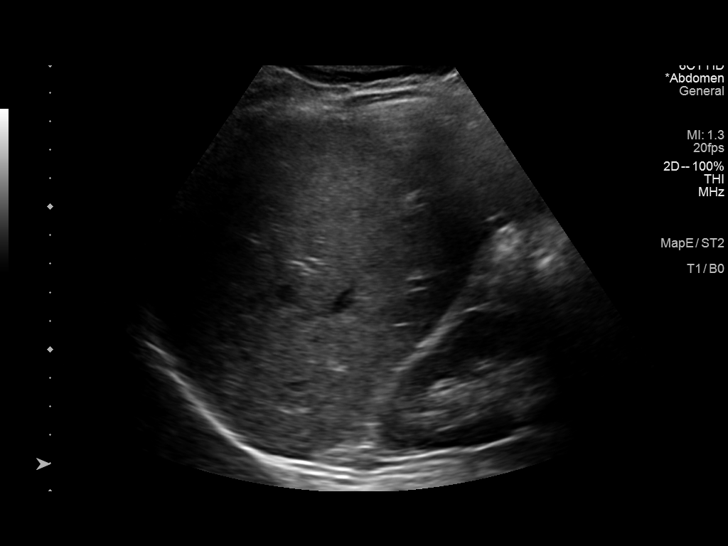
[im 33/97]
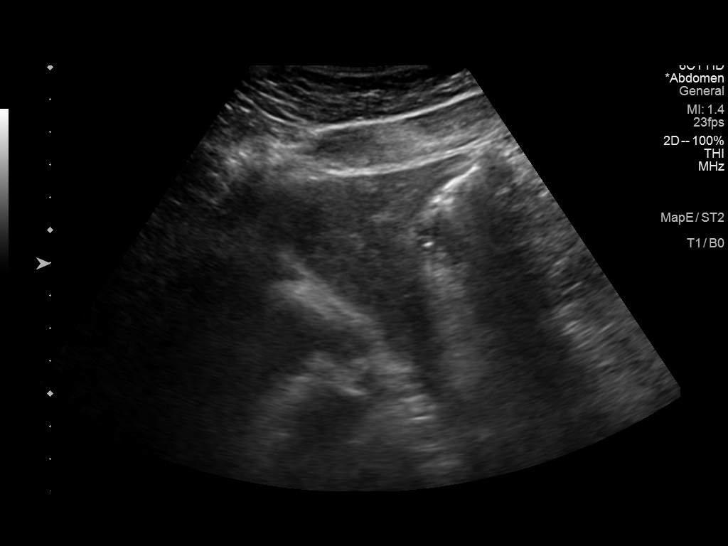
[im 37/97]
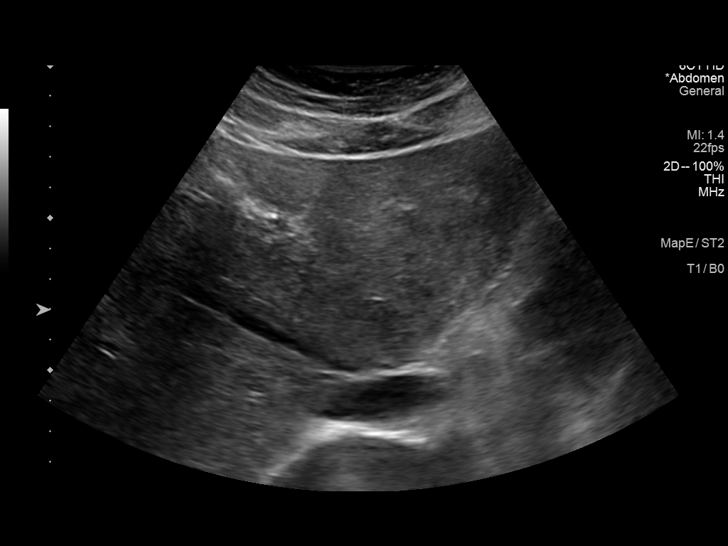
[im 45/97]
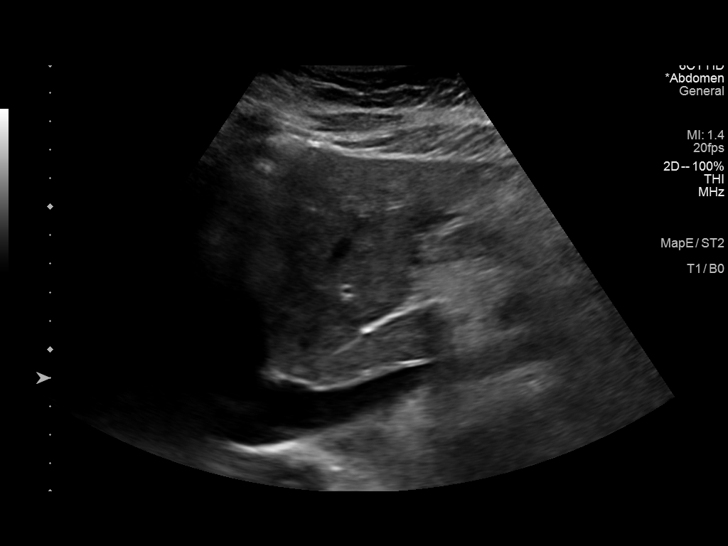
[im 53/97]
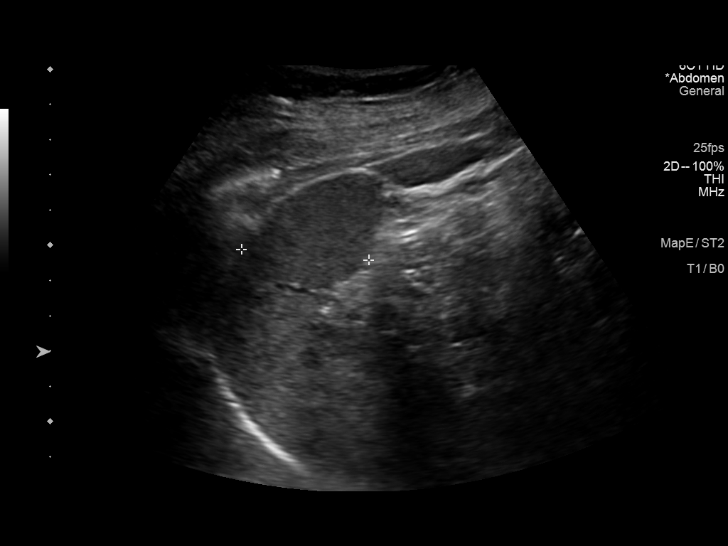
[im 61/97]
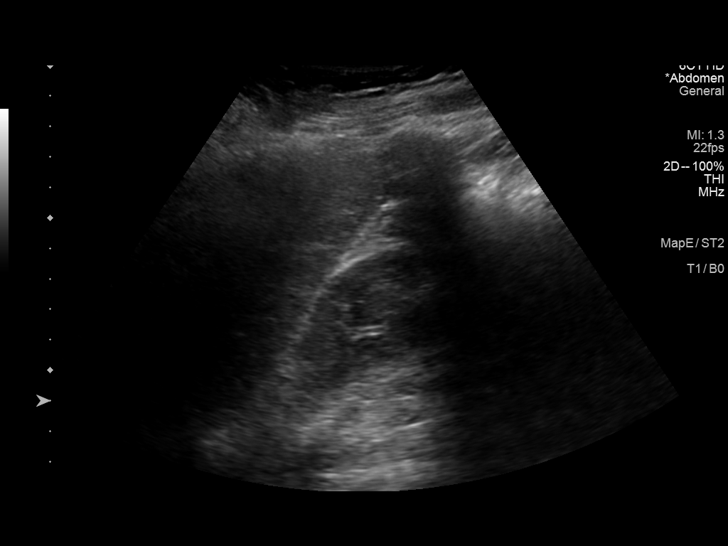
[im 65/97]
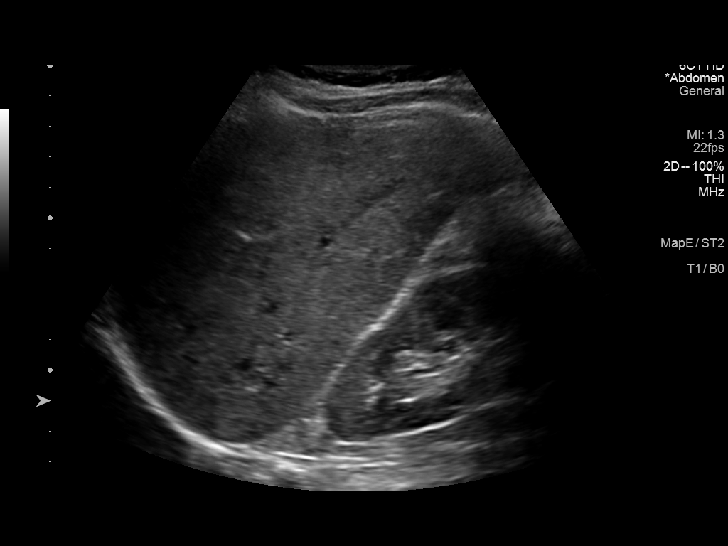
[im 73/97]
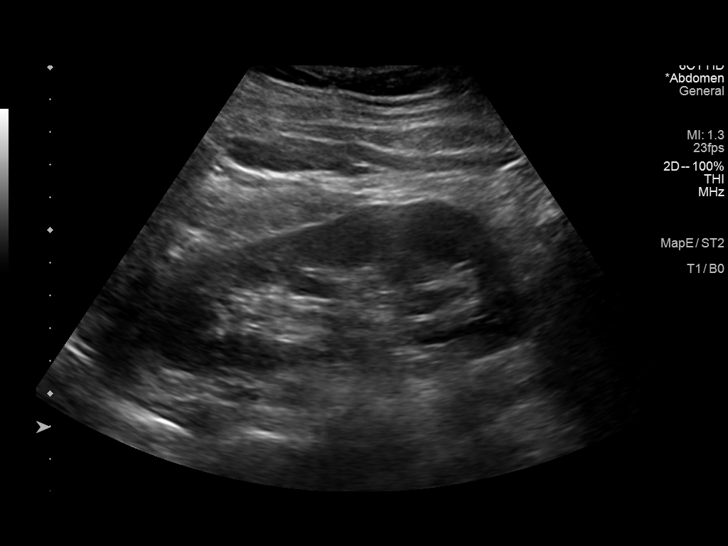
[im 81/97]
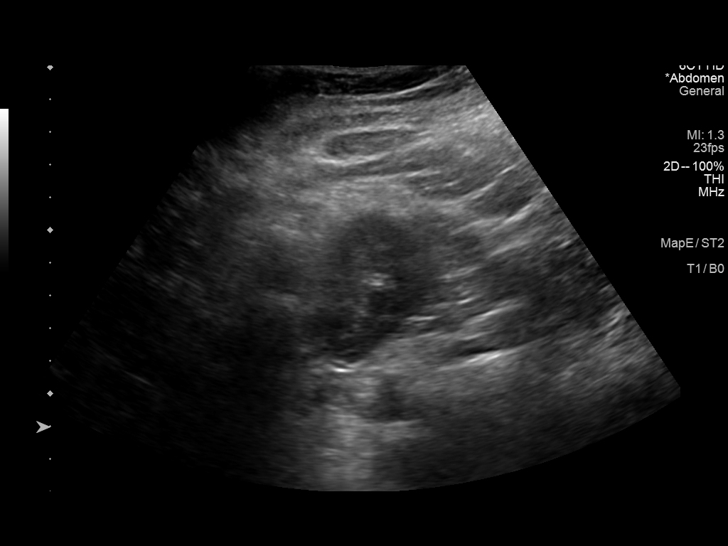
[im 89/97]
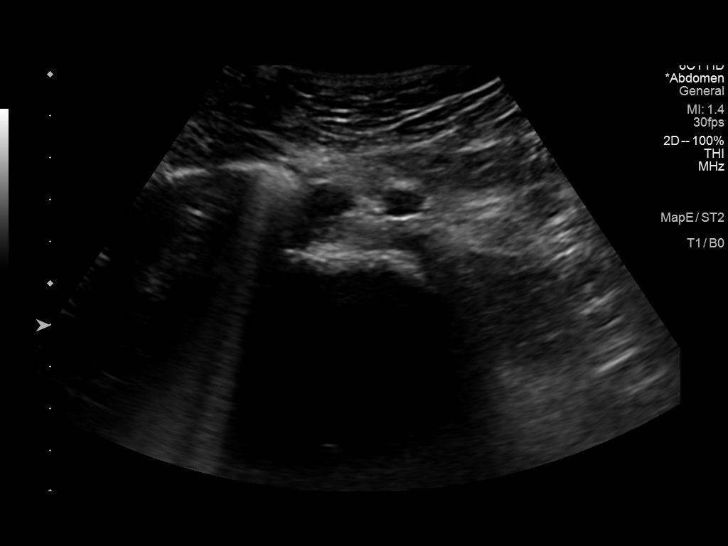
[im 97/97]
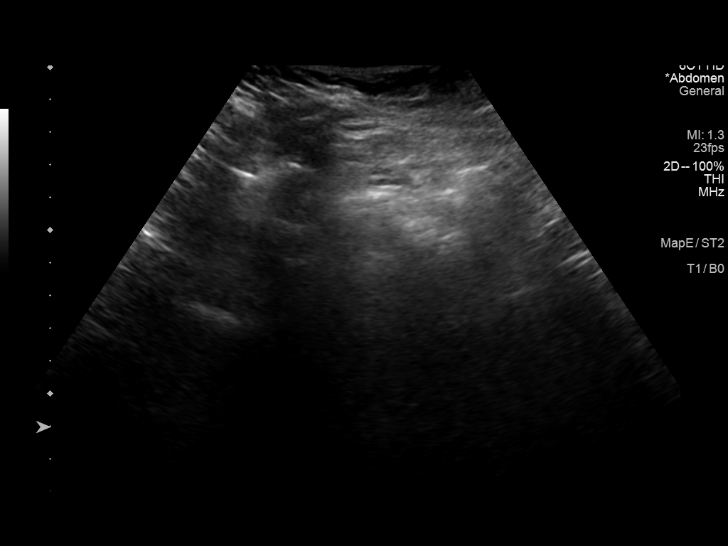

[14 of 25 positions shown; findings below may reference images not displayed]

FINDINGS: Gallbladder: No gallstones or wall thickening visualized. No
sonographic Murphy sign noted by sonographer.

Common bile duct: Diameter: 2 mm

Liver: Mild increased parenchymal echogenicity. Normal in size. No
mass or focal lesion. Portal vein is patent on color Doppler imaging
with normal direction of blood flow towards the liver.

IVC: No abnormality visualized.

Pancreas: Visualized portion unremarkable.

Spleen: Size and appearance within normal limits.

Right Kidney: Length: 10.4 cm. Echogenicity within normal limits. No
mass or hydronephrosis visualized.

Left Kidney: Length: 11.4 cm. Echogenicity within normal limits. No
mass or hydronephrosis visualized.

Abdominal aorta: No aneurysm visualized.

Other findings: Left upper quadrant assessment demonstrates
shadowing from bowel gas, otherwise unremarkable.
IMPRESSION: 1. No acute findings.  Normal gallbladder.  No bile duct dilation.
2. Mild increased liver parenchymal echogenicity suggests hepatic
steatosis.
3. No other abnormalities.

## 2021-09-23 DIAGNOSIS — R269 Unspecified abnormalities of gait and mobility: Secondary | ICD-10-CM | POA: Diagnosis not present

## 2021-09-23 DIAGNOSIS — M79672 Pain in left foot: Secondary | ICD-10-CM | POA: Diagnosis not present

## 2021-09-23 DIAGNOSIS — M25472 Effusion, left ankle: Secondary | ICD-10-CM | POA: Diagnosis not present

## 2021-09-23 DIAGNOSIS — M62572 Muscle wasting and atrophy, not elsewhere classified, left ankle and foot: Secondary | ICD-10-CM | POA: Diagnosis not present

## 2021-09-25 ENCOUNTER — Encounter: Payer: BC Managed Care – PPO | Admitting: Podiatry

## 2021-09-25 ENCOUNTER — Other Ambulatory Visit (HOSPITAL_COMMUNITY): Payer: Self-pay

## 2021-09-25 DIAGNOSIS — M62572 Muscle wasting and atrophy, not elsewhere classified, left ankle and foot: Secondary | ICD-10-CM | POA: Diagnosis not present

## 2021-09-25 DIAGNOSIS — R269 Unspecified abnormalities of gait and mobility: Secondary | ICD-10-CM | POA: Diagnosis not present

## 2021-09-25 DIAGNOSIS — M79672 Pain in left foot: Secondary | ICD-10-CM | POA: Diagnosis not present

## 2021-09-25 DIAGNOSIS — M25472 Effusion, left ankle: Secondary | ICD-10-CM | POA: Diagnosis not present

## 2021-09-26 ENCOUNTER — Other Ambulatory Visit (HOSPITAL_COMMUNITY): Payer: Self-pay

## 2021-09-27 DIAGNOSIS — M79672 Pain in left foot: Secondary | ICD-10-CM | POA: Diagnosis not present

## 2021-09-27 DIAGNOSIS — R269 Unspecified abnormalities of gait and mobility: Secondary | ICD-10-CM | POA: Diagnosis not present

## 2021-09-27 DIAGNOSIS — M62572 Muscle wasting and atrophy, not elsewhere classified, left ankle and foot: Secondary | ICD-10-CM | POA: Diagnosis not present

## 2021-09-27 DIAGNOSIS — M25472 Effusion, left ankle: Secondary | ICD-10-CM | POA: Diagnosis not present

## 2021-09-30 DIAGNOSIS — M62572 Muscle wasting and atrophy, not elsewhere classified, left ankle and foot: Secondary | ICD-10-CM | POA: Diagnosis not present

## 2021-09-30 DIAGNOSIS — R269 Unspecified abnormalities of gait and mobility: Secondary | ICD-10-CM | POA: Diagnosis not present

## 2021-09-30 DIAGNOSIS — M25472 Effusion, left ankle: Secondary | ICD-10-CM | POA: Diagnosis not present

## 2021-09-30 DIAGNOSIS — M79672 Pain in left foot: Secondary | ICD-10-CM | POA: Diagnosis not present

## 2021-10-02 DIAGNOSIS — E8881 Metabolic syndrome: Secondary | ICD-10-CM | POA: Diagnosis not present

## 2021-10-02 DIAGNOSIS — M069 Rheumatoid arthritis, unspecified: Secondary | ICD-10-CM | POA: Diagnosis not present

## 2021-10-02 DIAGNOSIS — M62572 Muscle wasting and atrophy, not elsewhere classified, left ankle and foot: Secondary | ICD-10-CM | POA: Diagnosis not present

## 2021-10-02 DIAGNOSIS — S86009A Unspecified injury of unspecified Achilles tendon, initial encounter: Secondary | ICD-10-CM | POA: Diagnosis not present

## 2021-10-02 DIAGNOSIS — R632 Polyphagia: Secondary | ICD-10-CM | POA: Diagnosis not present

## 2021-10-02 DIAGNOSIS — R269 Unspecified abnormalities of gait and mobility: Secondary | ICD-10-CM | POA: Diagnosis not present

## 2021-10-02 DIAGNOSIS — M79672 Pain in left foot: Secondary | ICD-10-CM | POA: Diagnosis not present

## 2021-10-02 DIAGNOSIS — M25472 Effusion, left ankle: Secondary | ICD-10-CM | POA: Diagnosis not present

## 2021-10-14 ENCOUNTER — Other Ambulatory Visit (HOSPITAL_COMMUNITY): Payer: Self-pay

## 2021-10-14 MED ORDER — WEGOVY 0.5 MG/0.5ML ~~LOC~~ SOAJ
SUBCUTANEOUS | 0 refills | Status: DC
  Filled 2021-10-14: qty 2, 28d supply, fill #0

## 2021-10-15 ENCOUNTER — Other Ambulatory Visit (HOSPITAL_COMMUNITY): Payer: Self-pay

## 2021-10-16 ENCOUNTER — Ambulatory Visit: Payer: No Typology Code available for payment source

## 2021-10-17 ENCOUNTER — Other Ambulatory Visit (HOSPITAL_COMMUNITY): Payer: Self-pay

## 2021-10-22 DIAGNOSIS — R269 Unspecified abnormalities of gait and mobility: Secondary | ICD-10-CM | POA: Diagnosis not present

## 2021-10-22 DIAGNOSIS — M79672 Pain in left foot: Secondary | ICD-10-CM | POA: Diagnosis not present

## 2021-10-22 DIAGNOSIS — M62572 Muscle wasting and atrophy, not elsewhere classified, left ankle and foot: Secondary | ICD-10-CM | POA: Diagnosis not present

## 2021-10-22 DIAGNOSIS — M25472 Effusion, left ankle: Secondary | ICD-10-CM | POA: Diagnosis not present

## 2021-10-23 ENCOUNTER — Ambulatory Visit
Admission: RE | Admit: 2021-10-23 | Discharge: 2021-10-23 | Disposition: A | Discharge status: Home / Self Care | Payer: BC Managed Care – PPO | Source: Ambulatory Visit | Attending: Family Medicine | Admitting: Family Medicine

## 2021-10-23 DIAGNOSIS — Z1231 Encounter for screening mammogram for malignant neoplasm of breast: Secondary | ICD-10-CM | POA: Diagnosis not present

## 2021-10-23 DIAGNOSIS — M069 Rheumatoid arthritis, unspecified: Secondary | ICD-10-CM | POA: Diagnosis not present

## 2021-10-23 DIAGNOSIS — Z9189 Other specified personal risk factors, not elsewhere classified: Secondary | ICD-10-CM | POA: Diagnosis not present

## 2021-10-23 DIAGNOSIS — R632 Polyphagia: Secondary | ICD-10-CM | POA: Diagnosis not present

## 2021-10-23 DIAGNOSIS — E8881 Metabolic syndrome: Secondary | ICD-10-CM | POA: Diagnosis not present

## 2021-10-25 ENCOUNTER — Other Ambulatory Visit: Payer: Self-pay | Admitting: Family Medicine

## 2021-10-25 DIAGNOSIS — M62572 Muscle wasting and atrophy, not elsewhere classified, left ankle and foot: Secondary | ICD-10-CM | POA: Diagnosis not present

## 2021-10-25 DIAGNOSIS — R928 Other abnormal and inconclusive findings on diagnostic imaging of breast: Secondary | ICD-10-CM

## 2021-10-25 DIAGNOSIS — R269 Unspecified abnormalities of gait and mobility: Secondary | ICD-10-CM | POA: Diagnosis not present

## 2021-10-25 DIAGNOSIS — M79672 Pain in left foot: Secondary | ICD-10-CM | POA: Diagnosis not present

## 2021-10-25 DIAGNOSIS — M25472 Effusion, left ankle: Secondary | ICD-10-CM | POA: Diagnosis not present

## 2021-10-29 DIAGNOSIS — R269 Unspecified abnormalities of gait and mobility: Secondary | ICD-10-CM | POA: Diagnosis not present

## 2021-10-29 DIAGNOSIS — M25472 Effusion, left ankle: Secondary | ICD-10-CM | POA: Diagnosis not present

## 2021-10-29 DIAGNOSIS — M79672 Pain in left foot: Secondary | ICD-10-CM | POA: Diagnosis not present

## 2021-10-29 DIAGNOSIS — M62572 Muscle wasting and atrophy, not elsewhere classified, left ankle and foot: Secondary | ICD-10-CM | POA: Diagnosis not present

## 2021-10-30 ENCOUNTER — Ambulatory Visit
Admission: RE | Admit: 2021-10-30 | Discharge: 2021-10-30 | Disposition: A | Discharge status: Home / Self Care | Payer: BC Managed Care – PPO | Source: Ambulatory Visit | Attending: Family Medicine | Admitting: Family Medicine

## 2021-10-30 ENCOUNTER — Other Ambulatory Visit: Payer: Self-pay | Admitting: Family Medicine

## 2021-10-30 ENCOUNTER — Ambulatory Visit: Payer: BC Managed Care – PPO

## 2021-10-30 DIAGNOSIS — R921 Mammographic calcification found on diagnostic imaging of breast: Secondary | ICD-10-CM

## 2021-10-30 DIAGNOSIS — R928 Other abnormal and inconclusive findings on diagnostic imaging of breast: Secondary | ICD-10-CM

## 2021-10-31 DIAGNOSIS — R269 Unspecified abnormalities of gait and mobility: Secondary | ICD-10-CM | POA: Diagnosis not present

## 2021-10-31 DIAGNOSIS — M25472 Effusion, left ankle: Secondary | ICD-10-CM | POA: Diagnosis not present

## 2021-10-31 DIAGNOSIS — M62572 Muscle wasting and atrophy, not elsewhere classified, left ankle and foot: Secondary | ICD-10-CM | POA: Diagnosis not present

## 2021-10-31 DIAGNOSIS — M79672 Pain in left foot: Secondary | ICD-10-CM | POA: Diagnosis not present

## 2021-11-07 DIAGNOSIS — M62572 Muscle wasting and atrophy, not elsewhere classified, left ankle and foot: Secondary | ICD-10-CM | POA: Diagnosis not present

## 2021-11-07 DIAGNOSIS — R269 Unspecified abnormalities of gait and mobility: Secondary | ICD-10-CM | POA: Diagnosis not present

## 2021-11-07 DIAGNOSIS — M25472 Effusion, left ankle: Secondary | ICD-10-CM | POA: Diagnosis not present

## 2021-11-07 DIAGNOSIS — M79672 Pain in left foot: Secondary | ICD-10-CM | POA: Diagnosis not present

## 2021-11-11 ENCOUNTER — Other Ambulatory Visit (HOSPITAL_COMMUNITY): Payer: Self-pay

## 2021-11-11 MED ORDER — SEMAGLUTIDE-WEIGHT MANAGEMENT 1 MG/0.5ML ~~LOC~~ SOAJ
SUBCUTANEOUS | 1 refills | Status: DC
  Filled 2021-11-11: qty 2, 28d supply, fill #0

## 2021-11-12 ENCOUNTER — Other Ambulatory Visit (HOSPITAL_COMMUNITY): Payer: Self-pay

## 2021-11-12 MED ORDER — WEGOVY 1 MG/0.5ML ~~LOC~~ SOAJ
1.0000 mg | SUBCUTANEOUS | 1 refills | Status: DC
  Filled 2021-12-13: qty 2, 28d supply, fill #0

## 2021-11-14 ENCOUNTER — Other Ambulatory Visit (HOSPITAL_COMMUNITY): Payer: Self-pay

## 2021-11-20 DIAGNOSIS — R632 Polyphagia: Secondary | ICD-10-CM | POA: Diagnosis not present

## 2021-11-20 DIAGNOSIS — M069 Rheumatoid arthritis, unspecified: Secondary | ICD-10-CM | POA: Diagnosis not present

## 2021-11-20 DIAGNOSIS — E6609 Other obesity due to excess calories: Secondary | ICD-10-CM | POA: Diagnosis not present

## 2021-11-20 DIAGNOSIS — E8881 Metabolic syndrome: Secondary | ICD-10-CM | POA: Diagnosis not present

## 2021-12-02 DIAGNOSIS — L821 Other seborrheic keratosis: Secondary | ICD-10-CM | POA: Diagnosis not present

## 2021-12-02 DIAGNOSIS — D485 Neoplasm of uncertain behavior of skin: Secondary | ICD-10-CM | POA: Diagnosis not present

## 2021-12-02 DIAGNOSIS — D2222 Melanocytic nevi of left ear and external auricular canal: Secondary | ICD-10-CM | POA: Diagnosis not present

## 2021-12-02 DIAGNOSIS — D224 Melanocytic nevi of scalp and neck: Secondary | ICD-10-CM | POA: Diagnosis not present

## 2021-12-02 DIAGNOSIS — D225 Melanocytic nevi of trunk: Secondary | ICD-10-CM | POA: Diagnosis not present

## 2021-12-02 DIAGNOSIS — Z85828 Personal history of other malignant neoplasm of skin: Secondary | ICD-10-CM | POA: Diagnosis not present

## 2021-12-11 DIAGNOSIS — R632 Polyphagia: Secondary | ICD-10-CM | POA: Diagnosis not present

## 2021-12-11 DIAGNOSIS — M722 Plantar fascial fibromatosis: Secondary | ICD-10-CM | POA: Diagnosis not present

## 2021-12-11 DIAGNOSIS — M069 Rheumatoid arthritis, unspecified: Secondary | ICD-10-CM | POA: Diagnosis not present

## 2021-12-11 DIAGNOSIS — M0579 Rheumatoid arthritis with rheumatoid factor of multiple sites without organ or systems involvement: Secondary | ICD-10-CM | POA: Diagnosis not present

## 2021-12-11 DIAGNOSIS — H9311 Tinnitus, right ear: Secondary | ICD-10-CM | POA: Diagnosis not present

## 2021-12-11 DIAGNOSIS — Z6835 Body mass index (BMI) 35.0-35.9, adult: Secondary | ICD-10-CM | POA: Diagnosis not present

## 2021-12-13 ENCOUNTER — Other Ambulatory Visit (HOSPITAL_COMMUNITY): Payer: Self-pay

## 2021-12-14 ENCOUNTER — Other Ambulatory Visit (HOSPITAL_COMMUNITY): Payer: Self-pay

## 2021-12-16 ENCOUNTER — Other Ambulatory Visit (HOSPITAL_COMMUNITY): Payer: Self-pay

## 2021-12-16 MED ORDER — WEGOVY 1 MG/0.5ML ~~LOC~~ SOAJ
1.0000 mg | SUBCUTANEOUS | 2 refills | Status: DC
  Filled 2021-12-16: qty 2, 28d supply, fill #0

## 2021-12-16 MED ORDER — SCOPOLAMINE 1 MG/3DAYS TD PT72
MEDICATED_PATCH | TRANSDERMAL | 0 refills | Status: DC
  Filled 2021-12-16: qty 9, 27d supply, fill #0

## 2021-12-16 MED ORDER — WEGOVY 1.7 MG/0.75ML ~~LOC~~ SOAJ
1.7000 mg | SUBCUTANEOUS | 3 refills | Status: DC
  Filled 2021-12-20 – 2022-01-13 (×4): qty 3, 28d supply, fill #0
  Filled 2022-02-07 – 2022-02-11 (×2): qty 3, 28d supply, fill #1

## 2021-12-17 DIAGNOSIS — Z01419 Encounter for gynecological examination (general) (routine) without abnormal findings: Secondary | ICD-10-CM | POA: Diagnosis not present

## 2021-12-17 DIAGNOSIS — Z1211 Encounter for screening for malignant neoplasm of colon: Secondary | ICD-10-CM | POA: Diagnosis not present

## 2021-12-17 DIAGNOSIS — Z1231 Encounter for screening mammogram for malignant neoplasm of breast: Secondary | ICD-10-CM | POA: Diagnosis not present

## 2021-12-18 ENCOUNTER — Other Ambulatory Visit (HOSPITAL_COMMUNITY): Payer: Self-pay

## 2021-12-18 DIAGNOSIS — H903 Sensorineural hearing loss, bilateral: Secondary | ICD-10-CM | POA: Diagnosis not present

## 2021-12-18 DIAGNOSIS — H9311 Tinnitus, right ear: Secondary | ICD-10-CM | POA: Diagnosis not present

## 2021-12-20 ENCOUNTER — Other Ambulatory Visit (HOSPITAL_COMMUNITY): Payer: Self-pay

## 2021-12-25 DIAGNOSIS — F418 Other specified anxiety disorders: Secondary | ICD-10-CM | POA: Diagnosis not present

## 2021-12-25 DIAGNOSIS — R632 Polyphagia: Secondary | ICD-10-CM | POA: Diagnosis not present

## 2021-12-27 DIAGNOSIS — N951 Menopausal and female climacteric states: Secondary | ICD-10-CM | POA: Diagnosis not present

## 2022-01-07 ENCOUNTER — Other Ambulatory Visit (HOSPITAL_COMMUNITY): Payer: Self-pay

## 2022-01-13 ENCOUNTER — Other Ambulatory Visit (HOSPITAL_COMMUNITY): Payer: Self-pay

## 2022-01-22 DIAGNOSIS — E669 Obesity, unspecified: Secondary | ICD-10-CM | POA: Diagnosis not present

## 2022-01-22 DIAGNOSIS — F418 Other specified anxiety disorders: Secondary | ICD-10-CM | POA: Diagnosis not present

## 2022-01-22 DIAGNOSIS — R632 Polyphagia: Secondary | ICD-10-CM | POA: Diagnosis not present

## 2022-01-22 DIAGNOSIS — M069 Rheumatoid arthritis, unspecified: Secondary | ICD-10-CM | POA: Diagnosis not present

## 2022-02-11 ENCOUNTER — Other Ambulatory Visit (HOSPITAL_COMMUNITY): Payer: Self-pay

## 2022-02-12 ENCOUNTER — Other Ambulatory Visit (HOSPITAL_COMMUNITY): Payer: Self-pay

## 2022-02-12 DIAGNOSIS — E669 Obesity, unspecified: Secondary | ICD-10-CM | POA: Diagnosis not present

## 2022-02-12 DIAGNOSIS — R632 Polyphagia: Secondary | ICD-10-CM | POA: Diagnosis not present

## 2022-02-12 DIAGNOSIS — R519 Headache, unspecified: Secondary | ICD-10-CM | POA: Diagnosis not present

## 2022-02-12 DIAGNOSIS — R197 Diarrhea, unspecified: Secondary | ICD-10-CM | POA: Diagnosis not present

## 2022-02-12 MED ORDER — ZEPBOUND 2.5 MG/0.5ML ~~LOC~~ SOAJ
2.5000 mg | SUBCUTANEOUS | 1 refills | Status: DC
  Filled 2022-02-12 – 2022-06-26 (×4): qty 2, 28d supply, fill #0

## 2022-02-13 ENCOUNTER — Other Ambulatory Visit (HOSPITAL_COMMUNITY): Payer: Self-pay

## 2022-02-13 MED ORDER — LOMAIRA 8 MG PO TABS
1.0000 | ORAL_TABLET | Freq: Every morning | ORAL | 0 refills | Status: DC
  Filled 2022-02-13 – 2022-02-17 (×2): qty 30, 30d supply, fill #0

## 2022-02-14 ENCOUNTER — Other Ambulatory Visit (HOSPITAL_COMMUNITY): Payer: Self-pay

## 2022-02-17 ENCOUNTER — Other Ambulatory Visit (HOSPITAL_COMMUNITY): Payer: Self-pay

## 2022-02-20 ENCOUNTER — Other Ambulatory Visit (HOSPITAL_COMMUNITY): Payer: Self-pay

## 2022-03-12 DIAGNOSIS — K5909 Other constipation: Secondary | ICD-10-CM | POA: Diagnosis not present

## 2022-03-12 DIAGNOSIS — R632 Polyphagia: Secondary | ICD-10-CM | POA: Diagnosis not present

## 2022-03-12 DIAGNOSIS — Z6832 Body mass index (BMI) 32.0-32.9, adult: Secondary | ICD-10-CM | POA: Diagnosis not present

## 2022-03-12 DIAGNOSIS — E669 Obesity, unspecified: Secondary | ICD-10-CM | POA: Diagnosis not present

## 2022-03-19 DIAGNOSIS — M0579 Rheumatoid arthritis with rheumatoid factor of multiple sites without organ or systems involvement: Secondary | ICD-10-CM | POA: Diagnosis not present

## 2022-04-02 ENCOUNTER — Other Ambulatory Visit (HOSPITAL_COMMUNITY): Payer: Self-pay

## 2022-04-02 DIAGNOSIS — M069 Rheumatoid arthritis, unspecified: Secondary | ICD-10-CM | POA: Diagnosis not present

## 2022-04-02 DIAGNOSIS — M2550 Pain in unspecified joint: Secondary | ICD-10-CM | POA: Diagnosis not present

## 2022-04-02 DIAGNOSIS — Z6833 Body mass index (BMI) 33.0-33.9, adult: Secondary | ICD-10-CM | POA: Diagnosis not present

## 2022-04-02 DIAGNOSIS — R632 Polyphagia: Secondary | ICD-10-CM | POA: Diagnosis not present

## 2022-04-02 MED ORDER — ALPRAZOLAM 0.25 MG PO TABS
0.2500 mg | ORAL_TABLET | Freq: Every day | ORAL | 0 refills | Status: DC | PRN
  Filled 2022-04-02: qty 10, 10d supply, fill #0

## 2022-04-03 ENCOUNTER — Other Ambulatory Visit (HOSPITAL_COMMUNITY): Payer: Self-pay

## 2022-04-14 ENCOUNTER — Other Ambulatory Visit (HOSPITAL_COMMUNITY): Payer: Self-pay

## 2022-04-15 ENCOUNTER — Other Ambulatory Visit (HOSPITAL_COMMUNITY): Payer: Self-pay

## 2022-04-15 MED ORDER — TECHLITE PEN NEEDLES 32G X 4 MM MISC
0 refills | Status: DC
  Filled 2022-04-15: qty 100, 90d supply, fill #0

## 2022-04-15 MED ORDER — SAXENDA 18 MG/3ML ~~LOC~~ SOPN
PEN_INJECTOR | SUBCUTANEOUS | 0 refills | Status: DC
  Filled 2022-04-15: qty 15, 30d supply, fill #0

## 2022-04-16 ENCOUNTER — Other Ambulatory Visit (HOSPITAL_COMMUNITY): Payer: Self-pay

## 2022-04-23 ENCOUNTER — Other Ambulatory Visit (HOSPITAL_COMMUNITY): Payer: Self-pay

## 2022-04-23 ENCOUNTER — Other Ambulatory Visit (HOSPITAL_BASED_OUTPATIENT_CLINIC_OR_DEPARTMENT_OTHER): Payer: Self-pay

## 2022-04-23 DIAGNOSIS — R632 Polyphagia: Secondary | ICD-10-CM | POA: Diagnosis not present

## 2022-04-23 DIAGNOSIS — M2550 Pain in unspecified joint: Secondary | ICD-10-CM | POA: Diagnosis not present

## 2022-04-24 ENCOUNTER — Other Ambulatory Visit (HOSPITAL_COMMUNITY): Payer: Self-pay

## 2022-05-06 ENCOUNTER — Ambulatory Visit
Admission: RE | Admit: 2022-05-06 | Discharge: 2022-05-06 | Disposition: A | Discharge status: Home / Self Care | Payer: BC Managed Care – PPO | Source: Ambulatory Visit | Attending: Family Medicine | Admitting: Family Medicine

## 2022-05-06 DIAGNOSIS — R921 Mammographic calcification found on diagnostic imaging of breast: Secondary | ICD-10-CM | POA: Diagnosis not present

## 2022-05-14 DIAGNOSIS — K219 Gastro-esophageal reflux disease without esophagitis: Secondary | ICD-10-CM | POA: Diagnosis not present

## 2022-05-14 DIAGNOSIS — R632 Polyphagia: Secondary | ICD-10-CM | POA: Diagnosis not present

## 2022-05-19 ENCOUNTER — Other Ambulatory Visit: Payer: Self-pay | Admitting: Family Medicine

## 2022-05-19 DIAGNOSIS — R921 Mammographic calcification found on diagnostic imaging of breast: Secondary | ICD-10-CM

## 2022-05-28 DIAGNOSIS — K219 Gastro-esophageal reflux disease without esophagitis: Secondary | ICD-10-CM | POA: Diagnosis not present

## 2022-05-28 DIAGNOSIS — M2550 Pain in unspecified joint: Secondary | ICD-10-CM | POA: Diagnosis not present

## 2022-05-28 DIAGNOSIS — R632 Polyphagia: Secondary | ICD-10-CM | POA: Diagnosis not present

## 2022-06-11 DIAGNOSIS — H9311 Tinnitus, right ear: Secondary | ICD-10-CM | POA: Diagnosis not present

## 2022-06-11 DIAGNOSIS — M722 Plantar fascial fibromatosis: Secondary | ICD-10-CM | POA: Diagnosis not present

## 2022-06-11 DIAGNOSIS — M0579 Rheumatoid arthritis with rheumatoid factor of multiple sites without organ or systems involvement: Secondary | ICD-10-CM | POA: Diagnosis not present

## 2022-06-26 ENCOUNTER — Other Ambulatory Visit (HOSPITAL_COMMUNITY): Payer: Self-pay

## 2022-06-26 MED ORDER — SAXENDA 18 MG/3ML ~~LOC~~ SOPN
PEN_INJECTOR | SUBCUTANEOUS | 0 refills | Status: DC
  Filled 2022-06-26: qty 15, 30d supply, fill #0

## 2022-06-27 ENCOUNTER — Other Ambulatory Visit (HOSPITAL_COMMUNITY): Payer: Self-pay

## 2022-06-27 MED ORDER — SAXENDA 18 MG/3ML ~~LOC~~ SOPN: 3.0000 mg | PEN_INJECTOR | Freq: Every day | SUBCUTANEOUS | 0 refills | Status: DC

## 2022-06-30 ENCOUNTER — Other Ambulatory Visit (HOSPITAL_COMMUNITY): Payer: Self-pay

## 2022-06-30 MED ORDER — ZEPBOUND 5 MG/0.5ML ~~LOC~~ SOAJ
5.0000 mg | SUBCUTANEOUS | 0 refills | Status: DC
  Filled 2022-06-30 – 2022-07-01 (×2): qty 2, 28d supply, fill #0

## 2022-07-01 ENCOUNTER — Other Ambulatory Visit (HOSPITAL_COMMUNITY): Payer: Self-pay

## 2022-07-04 ENCOUNTER — Other Ambulatory Visit (HOSPITAL_COMMUNITY): Payer: Self-pay

## 2022-07-09 DIAGNOSIS — K219 Gastro-esophageal reflux disease without esophagitis: Secondary | ICD-10-CM | POA: Diagnosis not present

## 2022-07-09 DIAGNOSIS — M2550 Pain in unspecified joint: Secondary | ICD-10-CM | POA: Diagnosis not present

## 2022-07-09 DIAGNOSIS — R632 Polyphagia: Secondary | ICD-10-CM | POA: Diagnosis not present

## 2022-08-05 ENCOUNTER — Other Ambulatory Visit (HOSPITAL_COMMUNITY): Payer: Self-pay

## 2022-08-05 MED ORDER — ZEPBOUND 5 MG/0.5ML ~~LOC~~ SOAJ
SUBCUTANEOUS | 2 refills | Status: DC
  Filled 2022-08-05: qty 2, 28d supply, fill #0

## 2022-08-06 ENCOUNTER — Other Ambulatory Visit (HOSPITAL_COMMUNITY): Payer: Self-pay

## 2022-08-06 MED ORDER — ZEPBOUND 7.5 MG/0.5ML ~~LOC~~ SOAJ
SUBCUTANEOUS | 2 refills | Status: DC
  Filled 2022-08-06: qty 2, 28d supply, fill #0

## 2022-08-07 ENCOUNTER — Other Ambulatory Visit (HOSPITAL_COMMUNITY): Payer: Self-pay

## 2022-08-08 DIAGNOSIS — N951 Menopausal and female climacteric states: Secondary | ICD-10-CM | POA: Diagnosis not present

## 2022-08-27 ENCOUNTER — Other Ambulatory Visit (HOSPITAL_COMMUNITY): Payer: Self-pay

## 2022-08-27 ENCOUNTER — Other Ambulatory Visit: Payer: Self-pay

## 2022-08-27 DIAGNOSIS — Z6832 Body mass index (BMI) 32.0-32.9, adult: Secondary | ICD-10-CM | POA: Diagnosis not present

## 2022-08-27 DIAGNOSIS — Z9189 Other specified personal risk factors, not elsewhere classified: Secondary | ICD-10-CM | POA: Diagnosis not present

## 2022-08-27 DIAGNOSIS — R632 Polyphagia: Secondary | ICD-10-CM | POA: Diagnosis not present

## 2022-08-27 DIAGNOSIS — M2550 Pain in unspecified joint: Secondary | ICD-10-CM | POA: Diagnosis not present

## 2022-08-27 MED ORDER — ZEPBOUND 7.5 MG/0.5ML ~~LOC~~ SOAJ
7.5000 mg | SUBCUTANEOUS | 0 refills | Status: DC
  Filled 2022-08-27: qty 2, 28d supply, fill #0
  Filled 2022-08-27: qty 6, 84d supply, fill #0

## 2022-09-01 ENCOUNTER — Other Ambulatory Visit: Payer: Self-pay

## 2022-09-10 DIAGNOSIS — M0579 Rheumatoid arthritis with rheumatoid factor of multiple sites without organ or systems involvement: Secondary | ICD-10-CM | POA: Diagnosis not present

## 2022-09-17 DIAGNOSIS — E669 Obesity, unspecified: Secondary | ICD-10-CM | POA: Diagnosis not present

## 2022-09-17 DIAGNOSIS — R632 Polyphagia: Secondary | ICD-10-CM | POA: Diagnosis not present

## 2022-10-15 DIAGNOSIS — E669 Obesity, unspecified: Secondary | ICD-10-CM | POA: Diagnosis not present

## 2022-10-15 DIAGNOSIS — R632 Polyphagia: Secondary | ICD-10-CM | POA: Diagnosis not present

## 2022-10-15 DIAGNOSIS — M2550 Pain in unspecified joint: Secondary | ICD-10-CM | POA: Diagnosis not present

## 2022-10-15 DIAGNOSIS — R350 Frequency of micturition: Secondary | ICD-10-CM | POA: Diagnosis not present

## 2022-10-15 DIAGNOSIS — R399 Unspecified symptoms and signs involving the genitourinary system: Secondary | ICD-10-CM | POA: Diagnosis not present

## 2022-10-29 ENCOUNTER — Ambulatory Visit
Admission: RE | Admit: 2022-10-29 | Discharge: 2022-10-29 | Disposition: A | Discharge status: Home / Self Care | Payer: BC Managed Care – PPO | Source: Ambulatory Visit | Attending: Family Medicine | Admitting: Family Medicine

## 2022-10-29 DIAGNOSIS — R921 Mammographic calcification found on diagnostic imaging of breast: Secondary | ICD-10-CM | POA: Diagnosis not present

## 2022-10-30 ENCOUNTER — Other Ambulatory Visit: Payer: Self-pay | Admitting: Family Medicine

## 2022-10-30 DIAGNOSIS — R921 Mammographic calcification found on diagnostic imaging of breast: Secondary | ICD-10-CM

## 2022-11-05 ENCOUNTER — Ambulatory Visit
Admission: RE | Admit: 2022-11-05 | Discharge: 2022-11-05 | Disposition: A | Discharge status: Home / Self Care | Payer: BC Managed Care – PPO | Source: Ambulatory Visit | Attending: Family Medicine | Admitting: Family Medicine

## 2022-11-05 DIAGNOSIS — R921 Mammographic calcification found on diagnostic imaging of breast: Secondary | ICD-10-CM | POA: Diagnosis not present

## 2022-11-05 DIAGNOSIS — N6012 Diffuse cystic mastopathy of left breast: Secondary | ICD-10-CM | POA: Diagnosis not present

## 2022-11-05 HISTORY — PX: BREAST BIOPSY: SHX20

## 2022-11-12 ENCOUNTER — Other Ambulatory Visit (HOSPITAL_COMMUNITY): Payer: Self-pay

## 2022-11-12 DIAGNOSIS — M2550 Pain in unspecified joint: Secondary | ICD-10-CM | POA: Diagnosis not present

## 2022-11-12 DIAGNOSIS — K219 Gastro-esophageal reflux disease without esophagitis: Secondary | ICD-10-CM | POA: Diagnosis not present

## 2022-11-12 DIAGNOSIS — R632 Polyphagia: Secondary | ICD-10-CM | POA: Diagnosis not present

## 2022-11-12 DIAGNOSIS — E669 Obesity, unspecified: Secondary | ICD-10-CM | POA: Diagnosis not present

## 2022-11-12 MED ORDER — TIRZEPATIDE-WEIGHT MANAGEMENT 7.5 MG/0.5ML ~~LOC~~ SOAJ
7.5000 mg | SUBCUTANEOUS | 2 refills | Status: DC
  Filled 2022-11-12: qty 2, 28d supply, fill #0
  Filled 2022-12-17: qty 2, 28d supply, fill #1

## 2022-11-13 ENCOUNTER — Other Ambulatory Visit (HOSPITAL_COMMUNITY): Payer: Self-pay

## 2022-11-15 ENCOUNTER — Other Ambulatory Visit (HOSPITAL_COMMUNITY): Payer: Self-pay

## 2022-11-24 DIAGNOSIS — N390 Urinary tract infection, site not specified: Secondary | ICD-10-CM | POA: Diagnosis not present

## 2022-11-24 DIAGNOSIS — R399 Unspecified symptoms and signs involving the genitourinary system: Secondary | ICD-10-CM | POA: Diagnosis not present

## 2022-11-24 DIAGNOSIS — F418 Other specified anxiety disorders: Secondary | ICD-10-CM | POA: Diagnosis not present

## 2022-12-03 DIAGNOSIS — Z8639 Personal history of other endocrine, nutritional and metabolic disease: Secondary | ICD-10-CM | POA: Diagnosis not present

## 2022-12-03 DIAGNOSIS — G4709 Other insomnia: Secondary | ICD-10-CM | POA: Diagnosis not present

## 2022-12-03 DIAGNOSIS — R632 Polyphagia: Secondary | ICD-10-CM | POA: Diagnosis not present

## 2022-12-17 DIAGNOSIS — L814 Other melanin hyperpigmentation: Secondary | ICD-10-CM | POA: Diagnosis not present

## 2022-12-17 DIAGNOSIS — D225 Melanocytic nevi of trunk: Secondary | ICD-10-CM | POA: Diagnosis not present

## 2022-12-17 DIAGNOSIS — Z85828 Personal history of other malignant neoplasm of skin: Secondary | ICD-10-CM | POA: Diagnosis not present

## 2022-12-17 DIAGNOSIS — D2222 Melanocytic nevi of left ear and external auricular canal: Secondary | ICD-10-CM | POA: Diagnosis not present

## 2022-12-25 ENCOUNTER — Emergency Department (HOSPITAL_COMMUNITY): Payer: BC Managed Care – PPO

## 2022-12-25 ENCOUNTER — Other Ambulatory Visit: Payer: Self-pay

## 2022-12-25 ENCOUNTER — Encounter (HOSPITAL_COMMUNITY): Payer: Self-pay

## 2022-12-25 ENCOUNTER — Inpatient Hospital Stay (HOSPITAL_COMMUNITY)
Admission: EM | Admit: 2022-12-25 | Discharge: 2022-12-27 | DRG: 176 | Disposition: A | Discharge status: Home / Self Care | Payer: BC Managed Care – PPO | Attending: Internal Medicine | Admitting: Internal Medicine

## 2022-12-25 DIAGNOSIS — M069 Rheumatoid arthritis, unspecified: Secondary | ICD-10-CM | POA: Diagnosis not present

## 2022-12-25 DIAGNOSIS — Z881 Allergy status to other antibiotic agents status: Secondary | ICD-10-CM

## 2022-12-25 DIAGNOSIS — Z803 Family history of malignant neoplasm of breast: Secondary | ICD-10-CM

## 2022-12-25 DIAGNOSIS — Z7985 Long-term (current) use of injectable non-insulin antidiabetic drugs: Secondary | ICD-10-CM

## 2022-12-25 DIAGNOSIS — Z794 Long term (current) use of insulin: Secondary | ICD-10-CM

## 2022-12-25 DIAGNOSIS — R000 Tachycardia, unspecified: Secondary | ICD-10-CM | POA: Diagnosis not present

## 2022-12-25 DIAGNOSIS — R0781 Pleurodynia: Secondary | ICD-10-CM | POA: Diagnosis not present

## 2022-12-25 DIAGNOSIS — Z79631 Long term (current) use of antimetabolite agent: Secondary | ICD-10-CM

## 2022-12-25 DIAGNOSIS — Z79890 Hormone replacement therapy (postmenopausal): Secondary | ICD-10-CM

## 2022-12-25 DIAGNOSIS — R911 Solitary pulmonary nodule: Secondary | ICD-10-CM | POA: Diagnosis not present

## 2022-12-25 DIAGNOSIS — Z79899 Other long term (current) drug therapy: Secondary | ICD-10-CM | POA: Diagnosis not present

## 2022-12-25 DIAGNOSIS — Z86711 Personal history of pulmonary embolism: Secondary | ICD-10-CM

## 2022-12-25 DIAGNOSIS — R079 Chest pain, unspecified: Secondary | ICD-10-CM | POA: Diagnosis not present

## 2022-12-25 DIAGNOSIS — Z780 Asymptomatic menopausal state: Secondary | ICD-10-CM | POA: Diagnosis not present

## 2022-12-25 DIAGNOSIS — Z832 Family history of diseases of the blood and blood-forming organs and certain disorders involving the immune mechanism: Secondary | ICD-10-CM | POA: Diagnosis not present

## 2022-12-25 DIAGNOSIS — I2693 Single subsegmental pulmonary embolism without acute cor pulmonale: Secondary | ICD-10-CM | POA: Diagnosis not present

## 2022-12-25 DIAGNOSIS — I82411 Acute embolism and thrombosis of right femoral vein: Secondary | ICD-10-CM | POA: Diagnosis not present

## 2022-12-25 DIAGNOSIS — I2699 Other pulmonary embolism without acute cor pulmonale: Secondary | ICD-10-CM

## 2022-12-25 DIAGNOSIS — K5900 Constipation, unspecified: Secondary | ICD-10-CM | POA: Diagnosis not present

## 2022-12-25 DIAGNOSIS — R0789 Other chest pain: Secondary | ICD-10-CM | POA: Diagnosis present

## 2022-12-25 DIAGNOSIS — Z8261 Family history of arthritis: Secondary | ICD-10-CM

## 2022-12-25 DIAGNOSIS — R7989 Other specified abnormal findings of blood chemistry: Secondary | ICD-10-CM | POA: Diagnosis not present

## 2022-12-25 DIAGNOSIS — Z808 Family history of malignant neoplasm of other organs or systems: Secondary | ICD-10-CM | POA: Diagnosis not present

## 2022-12-25 DIAGNOSIS — Z882 Allergy status to sulfonamides status: Secondary | ICD-10-CM | POA: Diagnosis not present

## 2022-12-25 HISTORY — DX: Other pulmonary embolism without acute cor pulmonale: I26.99

## 2022-12-25 LAB — CBC WITH DIFFERENTIAL/PLATELET
Abs Immature Granulocytes: 0.05 10*3/uL (ref 0.00–0.07)
Basophils Absolute: 0 10*3/uL (ref 0.0–0.1)
Basophils Relative: 0 %
Eosinophils Absolute: 0 10*3/uL (ref 0.0–0.5)
Eosinophils Relative: 0 %
HCT: 33.9 % — ABNORMAL LOW (ref 36.0–46.0)
Hemoglobin: 11.7 g/dL — ABNORMAL LOW (ref 12.0–15.0)
Immature Granulocytes: 1 %
Lymphocytes Relative: 6 %
Lymphs Abs: 0.6 10*3/uL — ABNORMAL LOW (ref 0.7–4.0)
MCH: 32.4 pg (ref 26.0–34.0)
MCHC: 34.5 g/dL (ref 30.0–36.0)
MCV: 93.9 fL (ref 80.0–100.0)
Monocytes Absolute: 0.4 10*3/uL (ref 0.1–1.0)
Monocytes Relative: 4 %
Neutro Abs: 9.8 10*3/uL — ABNORMAL HIGH (ref 1.7–7.7)
Neutrophils Relative %: 89 %
Platelets: 317 10*3/uL (ref 150–400)
RBC: 3.61 MIL/uL — ABNORMAL LOW (ref 3.87–5.11)
RDW: 12.2 % (ref 11.5–15.5)
WBC: 10.9 10*3/uL — ABNORMAL HIGH (ref 4.0–10.5)
nRBC: 0 % (ref 0.0–0.2)

## 2022-12-25 LAB — COMPREHENSIVE METABOLIC PANEL
ALT: 11 U/L (ref 0–44)
AST: 17 U/L (ref 15–41)
Albumin: 3.4 g/dL — ABNORMAL LOW (ref 3.5–5.0)
Alkaline Phosphatase: 52 U/L (ref 38–126)
Anion gap: 8 (ref 5–15)
BUN: 11 mg/dL (ref 6–20)
CO2: 22 mmol/L (ref 22–32)
Calcium: 8.8 mg/dL — ABNORMAL LOW (ref 8.9–10.3)
Chloride: 105 mmol/L (ref 98–111)
Creatinine, Ser: 0.86 mg/dL (ref 0.44–1.00)
GFR, Estimated: >60 mL/min (ref >60)
Glucose, Bld: 111 mg/dL — ABNORMAL HIGH (ref 70–99)
Potassium: 3.5 mmol/L (ref 3.5–5.1)
Sodium: 135 mmol/L (ref 135–145)
Total Bilirubin: 0.7 mg/dL (ref 0.3–1.2)
Total Protein: 7.1 g/dL (ref 6.5–8.1)

## 2022-12-25 LAB — BRAIN NATRIURETIC PEPTIDE: B Natriuretic Peptide: 20.9 pg/mL (ref 0.0–100.0)

## 2022-12-25 LAB — I-STAT CREATININE, ED: Creatinine, Ser: 0.8 mg/dL (ref 0.44–1.00)

## 2022-12-25 LAB — TROPONIN I (HIGH SENSITIVITY)
Troponin I (High Sensitivity): <2 ng/L (ref <18)
Troponin I (High Sensitivity): <2 ng/L (ref <18)

## 2022-12-25 LAB — HCG, SERUM, QUALITATIVE: Preg, Serum: NEGATIVE

## 2022-12-25 MED ORDER — OMEPRAZOLE MAGNESIUM 20 MG PO TBEC: 20.0000 mg | DELAYED_RELEASE_TABLET | Freq: Every day | ORAL | Status: DC

## 2022-12-25 MED ORDER — FENTANYL CITRATE PF 50 MCG/ML IJ SOSY
50.0000 ug | PREFILLED_SYRINGE | Freq: Once | INTRAMUSCULAR | Status: DC
  Filled 2022-12-25: qty 1

## 2022-12-25 MED ORDER — HEPARIN BOLUS VIA INFUSION
2000.0000 [IU] | Freq: Once | INTRAVENOUS | Status: AC
  Administered 2022-12-25: 2000 [IU] via INTRAVENOUS
  Filled 2022-12-25: qty 2000

## 2022-12-25 MED ORDER — ALPRAZOLAM 0.25 MG PO TABS: 0.1250 mg | ORAL_TABLET | Freq: Every day | ORAL | Status: DC | PRN

## 2022-12-25 MED ORDER — ALUM & MAG HYDROXIDE-SIMETH 200-200-20 MG/5ML PO SUSP
30.0000 mL | Freq: Four times a day (QID) | ORAL | Status: DC | PRN
  Administered 2022-12-25: 30 mL via ORAL
  Filled 2022-12-25: qty 30

## 2022-12-25 MED ORDER — HEPARIN (PORCINE) 25000 UT/250ML-% IV SOLN
1400.0000 [IU]/h | INTRAVENOUS | Status: DC
  Administered 2022-12-25 – 2022-12-26 (×2): 1200 [IU]/h via INTRAVENOUS
  Filled 2022-12-25 (×2): qty 250

## 2022-12-25 MED ORDER — OXYCODONE HCL 5 MG PO TABS: 2.5000 mg | ORAL_TABLET | ORAL | Status: DC | PRN

## 2022-12-25 MED ORDER — FOLIC ACID 1 MG PO TABS
0.5000 mg | ORAL_TABLET | Freq: Every day | ORAL | Status: DC
  Administered 2022-12-26 – 2022-12-27 (×2): 0.5 mg via ORAL
  Filled 2022-12-25 (×2): qty 1

## 2022-12-25 MED ORDER — OXYCODONE HCL 5 MG PO TABS: 2.5000 mg | ORAL_TABLET | Freq: Four times a day (QID) | ORAL | Status: DC | PRN

## 2022-12-25 MED ORDER — ACETAMINOPHEN 500 MG PO TABS
1000.0000 mg | ORAL_TABLET | Freq: Four times a day (QID) | ORAL | Status: DC | PRN
  Administered 2022-12-25 – 2022-12-26 (×3): 1000 mg via ORAL
  Filled 2022-12-25 (×3): qty 2

## 2022-12-25 MED ORDER — SODIUM CHLORIDE 0.9% FLUSH
3.0000 mL | Freq: Two times a day (BID) | INTRAVENOUS | Status: DC
  Administered 2022-12-25 – 2022-12-26 (×2): 3 mL via INTRAVENOUS

## 2022-12-25 MED ORDER — IOHEXOL 350 MG/ML SOLN
80.0000 mL | Freq: Once | INTRAVENOUS | Status: AC | PRN
  Administered 2022-12-25: 80 mL via INTRAVENOUS

## 2022-12-25 MED ORDER — OXYCODONE HCL 5 MG PO TABS
5.0000 mg | ORAL_TABLET | ORAL | Status: DC | PRN
  Administered 2022-12-26: 5 mg via ORAL
  Filled 2022-12-25: qty 1

## 2022-12-25 MED ORDER — FOLIC ACID 400 MCG PO TABS: 400.0000 ug | ORAL_TABLET | Freq: Every day | ORAL | Status: DC

## 2022-12-25 MED ORDER — PANTOPRAZOLE SODIUM 40 MG PO TBEC
40.0000 mg | DELAYED_RELEASE_TABLET | Freq: Every day | ORAL | Status: DC
  Administered 2022-12-26 – 2022-12-27 (×2): 40 mg via ORAL
  Filled 2022-12-25 (×2): qty 1

## 2022-12-25 NOTE — ED Provider Notes (Signed)
[place] EMERGENCY DEPARTMENT AT [facility] Provider Note   CSN: [id number] Arrival date & time: 12/25/22  1439     History  Chief Complaint  Patient presents with   Abnormal Lab    Marissa Marshall is a 57 y.o. female with RA, presents with concern for left-sided rib pain that started yesterday.  States this started fairly suddenly, no inciting injuries.  Reports it hurts worse when she takes deep breath in and it was hard to find a comfortable position to sleep in.  She was evaluated by urgent care and told she had a elevated D-dimer and to come here for further evaluation.  Denies any chest pain or shortness of breath, fevers or chills.  Denies calf pain or swelling.  She is on a NuvaRing.  Denies any recent hospitalizations or surgeries. Had a plane ride one week ago that was about 3 hours long.   Abnormal Lab      Home Medications Prior to Admission medications   Medication Sig Start Date End Date Taking? Authorizing Provider  ALPRAZolam Prudy Feeler) 0.25 MG tablet Take 1 tablet (0.25 mg total) by mouth as needed for anxiety 04/02/22     cetirizine (ZYRTEC ALLERGY) 10 MG tablet 1 tablet    [provider]  drospirenone-ethinyl estradiol (YAZ) 3-0.02 MG tablet Take 1 tablet by mouth daily.    [provider]  Insulin Pen Needle (TECHLITE PEN NEEDLES) 32G X 4 MM MISC Use one pen needle to inject Saxenda under the skin once daily. 04/14/22     Liraglutide -Weight Management (SAXENDA) 18 MG/3ML SOPN Inject 3 mg into the skin once a day 30 days 09/19/21   Helane Rima, DO  Liraglutide -Weight Management (SAXENDA) 18 MG/3ML SOPN Inject 3mg  (pt to start with 0.6mg  daily) under the skin once daily 06/26/22     Liraglutide -Weight Management (SAXENDA) 18 MG/3ML SOPN Inject 3 mg into the skin daily. 06/27/22     methotrexate (RHEUMATREX) 2.5 MG tablet 6 tablets    [provider]  Phentermine HCl (LOMAIRA) 8 MG TABS Take 1 tablet by mouth in the morning.  02/13/22     scopolamine (TRANSDERM-SCOP) 1 MG/3DAYS Apply 1 patch to skin behind the ear as needed every 72 hours 12/16/21     Semaglutide-Weight Management (WEGOVY) 0.25 MG/0.5ML SOAJ Inject 0.25mg  (0.5 mL) Subcutaneously weekly 09/18/21     Semaglutide-Weight Management (WEGOVY) 0.5 MG/0.5ML SOAJ Inject 1 pen (0.5 MG) into the skin once a week 10/14/21     Semaglutide-Weight Management (WEGOVY) 1 MG/0.5ML SOAJ Take 1 mg by mouth once a week. 11/11/21     Semaglutide-Weight Management (WEGOVY) 1 MG/0.5ML SOAJ Inject 1 mg into the skin once a week. 12/16/21     Semaglutide-Weight Management 1 MG/0.5ML SOAJ Inject 1 mg into the skin once a week 11/11/21     tirzepatide (ZEPBOUND) 5 MG/0.5ML Pen Inject 5mg  (0.5 ml) under the skin once weekly. 08/05/22     tirzepatide (ZEPBOUND) 7.5 MG/0.5ML Pen Inject 7.5mg  under the skin once weekly. 08/06/22     tirzepatide (ZEPBOUND) 7.5 MG/0.5ML Pen Inject 7.5 mg into the skin once a week. 08/27/22     tirzepatide (ZEPBOUND) 7.5 MG/0.5ML Pen Inject 7.5 mg into the skin once a week. 11/12/22     tirzepatide (ZEPBOUND) 2.5 MG/0.5ML Pen Inject 2.5 mg into the skin once a week. 02/12/22         Allergies    Sulfa antibiotics and Tetracyclines & related    Review  of Systems   Review of Systems  Constitutional:  Negative for fever.  Respiratory:  Negative for shortness of breath.     Physical Exam Updated Vital Signs BP 107/77   Pulse (!) 108   Temp 98 F (36.7 C)   Resp 16   SpO2 100%  Physical Exam Vitals and nursing note reviewed.  Constitutional:      General: She is not in acute distress.    Appearance: She is well-developed.     Comments: Well-appearing, talking in full sentences  HENT:     Head: Normocephalic and atraumatic.  Eyes:     Conjunctiva/sclera: Conjunctivae normal.  Cardiovascular:     Rate and Rhythm: Regular rhythm. Tachycardia present.     Heart sounds: No murmur heard. Pulmonary:     Effort: Pulmonary effort is normal. No  respiratory distress.     Breath sounds: Normal breath sounds.  Abdominal:     Palpations: Abdomen is soft.     Tenderness: There is no abdominal tenderness.  Musculoskeletal:        General: No swelling.     Cervical back: Neck supple.     Comments: Chest wall nontender to palpation No right or left lower extremity edema, no tenderness to calf palpation bilaterally  Skin:    General: Skin is warm and dry.     Capillary Refill: Capillary refill takes less than 2 seconds.  Neurological:     Mental Status: She is alert.  Psychiatric:        Mood and Affect: Mood normal.     ED Results / Procedures / Treatments   Labs (all labs ordered are listed, but only abnormal results are displayed) Labs Reviewed  CBC WITH DIFFERENTIAL/PLATELET - Abnormal; Notable for the following components:      Result Value   WBC 10.9 (*)    RBC 3.61 (*)    Hemoglobin 11.7 (*)    HCT 33.9 (*)    Neutro Abs 9.8 (*)    Lymphs Abs 0.6 (*)    All other components within normal limits  COMPREHENSIVE METABOLIC PANEL - Abnormal; Notable for the following components:   Glucose, Bld 111 (*)    Calcium 8.8 (*)    Albumin 3.4 (*)    All other components within normal limits  BRAIN NATRIURETIC PEPTIDE  HIV ANTIBODY (ROUTINE TESTING W REFLEX)  BASIC METABOLIC PANEL  CBC  MAGNESIUM  PHOSPHORUS  I-STAT CREATININE, ED  TROPONIN I (HIGH SENSITIVITY)  TROPONIN I (HIGH SENSITIVITY)    EKG EKG Interpretation Date/Time:  Thursday December 25 2022 15:29:16 EDT Ventricular Rate:  108 PR Interval:  137 QRS Duration:  83 QT Interval:  337 QTC Calculation: 452 R Axis:   73  Text Interpretation: Sinus tachycardia Probable left atrial enlargement Low voltage, precordial leads Confirmed by Alvino Blood ([id number]) on 12/25/2022 4:19:55 PM  Radiology VAS [place] LOWER EXTREMITY VENOUS (DVT) (7a-7p)  Result Date: 12/25/2022  Lower Venous DVT Study Patient Name:  Marissa Marshall  Date of Exam:   12/25/2022 Medical Rec  #: [id number]      Accession #:    [id number] Date of Birth: Mar 27, 1965      Patient Gender: F Patient Age:   35 years Exam Location:  [facility] Procedure:      VAS [place] LOWER EXTREMITY VENOUS (DVT) Referring Phys: Arabella Merles --------------------------------------------------------------------------------  Indications: PE in Hx.  Risk Factors: None identified. Comparison Study: No prior study Performing Technologist: Shona Simpson  Examination Guidelines: A  complete evaluation includes B-mode imaging, spectral Doppler, color Doppler, and power Doppler as needed of all accessible portions of each vessel. Bilateral testing is considered an integral part of a complete examination. Limited examinations for reoccurring indications may be performed as noted. The reflux portion of the exam is performed with the patient in reverse Trendelenburg.  +---------+---------------+---------+-----------+---------------+-------------+ RIGHT    CompressibilityPhasicitySpontaneityProperties     Thrombus                                                                 Aging         +---------+---------------+---------+-----------+---------------+-------------+ CFV      Partial        Yes      Yes        brightly       Acute                                                     echogenic                    +---------+---------------+---------+-----------+---------------+-------------+ SFJ      Full                                                            +---------+---------------+---------+-----------+---------------+-------------+ FV Prox  Full                                                            +---------+---------------+---------+-----------+---------------+-------------+ FV Mid   Full                                                            +---------+---------------+---------+-----------+---------------+-------------+ FV DistalFull                                                             +---------+---------------+---------+-----------+---------------+-------------+ PFV      None           No       No         brightly       Acute  echogenic                    +---------+---------------+---------+-----------+---------------+-------------+ POP      Full           Yes      Yes                                     +---------+---------------+---------+-----------+---------------+-------------+ PTV      Full                                                            +---------+---------------+---------+-----------+---------------+-------------+ PERO     Full                                                            +---------+---------------+---------+-----------+---------------+-------------+   +---------+---------------+---------+-----------+----------+--------------+ LEFT     CompressibilityPhasicitySpontaneityPropertiesThrombus Aging +---------+---------------+---------+-----------+----------+--------------+ CFV      Full           Yes      Yes                                 +---------+---------------+---------+-----------+----------+--------------+ SFJ      Full                                                        +---------+---------------+---------+-----------+----------+--------------+ FV Prox  Full                                                        +---------+---------------+---------+-----------+----------+--------------+ FV Mid   Full                                                        +---------+---------------+---------+-----------+----------+--------------+ FV DistalFull                                                        +---------+---------------+---------+-----------+----------+--------------+ PFV      Full                                                         +---------+---------------+---------+-----------+----------+--------------+ POP      Full  Yes      Yes                                 +---------+---------------+---------+-----------+----------+--------------+ PTV      Full                                                        +---------+---------------+---------+-----------+----------+--------------+ PERO     Full                                                        +---------+---------------+---------+-----------+----------+--------------+    Summary: RIGHT: - Findings consistent with acute deep vein thrombosis involving the right common femoral vein, and right proximal profunda vein.  - No cystic structure found in the popliteal fossa.  LEFT: - There is no evidence of deep vein thrombosis in the lower extremity.  - No cystic structure found in the popliteal fossa.  *See table(s) above for measurements and observations.    Preliminary    CT Angio Chest PE W and/or Wo Contrast  Result Date: 12/25/2022 CLINICAL DATA:  Positive D-dimer and chest pain on the left, initial encounter EXAM: CT ANGIOGRAPHY CHEST WITH CONTRAST TECHNIQUE: Multidetector CT imaging of the chest was performed using the standard protocol during bolus administration of intravenous contrast. Multiplanar CT image reconstructions and MIPs were obtained to evaluate the vascular anatomy. RADIATION DOSE REDUCTION: This exam was performed according to the departmental dose-optimization program which includes automated exposure control, adjustment of the mA and/or kV according to patient size and/or use of iterative reconstruction technique. CONTRAST:  80mL OMNIPAQUE IOHEXOL 350 MG/ML SOLN COMPARISON:  None Available. FINDINGS: Cardiovascular: Thoracic aorta shows no aneurysmal dilatation or dissection. Heart is not significantly enlarged in size. Pulmonary artery is well visualized and demonstrates a normal branching pattern bilaterally. Extensive left-sided  central lobar and left lower lobe pulmonary emboli are noted. No evidence of right heart strain is seen. No significant emboli are noted on the right. Mediastinum/Nodes: Thoracic inlet is within normal limits. No hilar or mediastinal adenopathy is noted. The esophagus as visualized is within normal limits. Lungs/Pleura: Mild right basilar atelectasis is seen. A 3 mm noncalcified nodule is noted in the right lower lobe. This is best visualized on image number 115 of series 12. No other nodules are seen within the right lung. The left lung is well aerated with lower lobe airspace opacity likely related to the known pulmonary emboli. Upper Abdomen: Visualized upper abdomen is unremarkable. Musculoskeletal: No chest wall abnormality. No acute or significant osseous findings. Review of the MIP images confirms the above findings. IMPRESSION: Diffuse left lower lobe pulmonary emboli without evidence of right heart strain. Changes in the left lower lobe likely related to the central pulmonary emboli and possibly representing early changes of pulmonary infarct. Right solid pulmonary nodule measuring 3 mm. Per Fleischner Society Guidelines, no routine follow-up imaging is recommended. These guidelines do not apply to immunocompromised patients and patients with cancer. Follow up in patients with significant comorbidities as clinically warranted. For lung cancer screening, adhere to Lung-RADS guidelines. Reference: Radiology. 2017; 284(1):228-43. Critical Value/emergent results  were called by telephone at the time of interpretation on 12/25/2022 at 6:40 pm to [facility] PA, who verbally acknowledged these results. Electronically Signed   By: Alcide Clever M.D.   On: 12/25/2022 18:41    Procedures Procedures    Medications Ordered in ED Medications  fentaNYL (SUBLIMAZE) injection 50 mcg (50 mcg Intravenous Patient Refused/Not Given 12/25/22 1853)  sodium chloride flush (NS) 0.9 % injection 3 mL (has no  administration in time range)  acetaminophen (TYLENOL) tablet 1,000 mg (has no administration in time range)  oxyCODONE (Oxy IR/ROXICODONE) immediate release tablet 2.5 mg (has no administration in time range)    Or  oxyCODONE (Oxy IR/ROXICODONE) immediate release tablet 5 mg (has no administration in time range)  iohexol (OMNIPAQUE) 350 MG/ML injection 80 mL (80 mLs Intravenous Contrast Given 12/25/22 1712)    ED Course/ Medical Decision Making/ A&P Clinical Course as of 12/25/22 2042  Thu Dec 25, 2022  2018 VAS [place] LOWER EXTREMITY VENOUS (DVT) (7a-7p) [AF]    Clinical Course User Index [AF] Arabella Merles, PA-C                                 Medical Decision Making Amount and/or Complexity of Data Reviewed Labs: ordered. Radiology: ordered. Decision-making details documented in ED Course.  Risk Prescription drug management. Decision regarding hospitalization.     Differential diagnosis includes but is not limited to DVT, PE, musculoskeletal pain, pneumonia  ED Course:  Patient overall well-appearing, in no acute distress.  She does have a tachycardia to 111 upon arrival.  She was found to have an elevated D-dimer at urgent care in the 3000's, and referred here for further evaluation.  CTA chest was obtained which showed diffuse left lower lobe pulmonary emboli without evidence of right heart strain.  There is concern for possible changes of pulmonary infarct.  Troponins both under 2, BNP within normal limits.  Risk factor for thrombus is a NuvaRing.  DVT ultrasound for the bilateral lower extremities was obtained which showed left sided DVT involving the right femoral vein.  I discussed this case with Dr. Suezanne Jacquet who feels patient may benefit from admission given the possible infarct. Upon re-evaluation, patient still well appearing, stable. I explained that she has both DVT and PE and will plan on admission. Patient in agreement with this plan.  Patient started on  Lovenox  Impression: Diffuse pulmonary embolism of the left lower lobe DVT left femoral vein   Disposition:  Admitted with Dr. Lazarus Salines  Lab Tests: I Ordered, and personally interpreted labs.  The pertinent results include:   CBC with slight leukocytosis at 10.9, hemoglobin slightly low 11.7 CMP unremarkable Initial troponin less than 2, repeat under 2 BNP 20.9  Imaging Studies ordered: I ordered imaging studies including CTA Chest  I independently visualized the imaging with scope of interpretation limited to determining acute life threatening conditions related to emergency care. Imaging showed  Diffuse left lower lobe pulmonary emboli without evidence of right heart strain. Changes in the left lower lobe likely related to the central pulmonary emboli and possibly representing early changes of pulmonary infarct DVT ultrasound of the lower extremity showed normal of the right common vein I agree with the radiologist interpretation   Cardiac Monitoring: / EKG: The patient was maintained on a cardiac monitor.  I personally viewed and interpreted the cardiac monitored which showed an underlying rhythm of: Sinus tachycardia   Consultations  Obtained: I requested consultation with the hospitalist Dr Lazarus Salines,  and discussed lab and imaging findings as well as pertinent plan - they recommend: admission and starting lovenox  External records from outside source obtained and reviewed including D-dimer from urgent care earlier today at 3170             Final Clinical Impression(s) / ED Diagnoses Final diagnoses:  Acute pulmonary embolism without acute cor pulmonale, unspecified pulmonary embolism type (HCC)  Acute deep vein thrombosis (DVT) of right femoral vein [facility])    Rx / DC Orders ED Discharge Orders     None         Arabella Merles, Cordelia Poche 12/25/22 2043    Lonell Grandchild, MD 12/25/22 2049

## 2022-12-25 NOTE — ED Notes (Signed)
Pateint offered pain medication.  She stated she was not in enough pain to need it.

## 2022-12-25 NOTE — Progress Notes (Signed)
PHARMACY - ANTICOAGULATION CONSULT NOTE  Pharmacy Consult for IV heparin Indication: pulmonary embolus  Allergies  Allergen Reactions   Sulfa Antibiotics    Tetracyclines & Related     Patient Measurements: Height: 5\' 5"  (165.1 cm) Weight: 81.6 kg (180 lb) IBW/kg (Calculated) : 57 Heparin Dosing Weight: 74 kg  Vital Signs: Temp: 98 F (36.7 C) (10/24 1919) Temp Source: Oral (10/24 1445) BP: 107/77 (10/24 1915) Pulse Rate: 108 (10/24 1915)  Labs: Recent Labs    12/25/22 1545 12/25/22 1645 12/25/22 1652  HGB 11.7*  --   --   HCT 33.9*  --   --   PLT 317  --   --   CREATININE 0.86  --  0.80  TROPONINIHS <2 <2  --     Estimated Creatinine Clearance: 83.8 mL/min (by C-G formula based on SCr of 0.8 mg/dL).   Medical History: Past Medical History:  Diagnosis Date   Allergy    ASCUS (atypical squamous cells of undetermined significance) on Pap smear    Left lower quadrant pain     Medications:  Scheduled:   fentaNYL (SUBLIMAZE) injection  50 mcg Intravenous Once   sodium chloride flush  3 mL Intravenous Q12H   Infusions:   Assessment: 57 yo with elevated d-dimer presented to rule out DVT ([place] pending) and PE. Patient found to have PE to start IV heparin. Baseline labs obtained. Not on any anticoagulants prior to admission   Goal of Therapy:  Heparin level 0.3-0.7 units/ml Monitor platelets by anticoagulation protocol: Yes   Plan:  IV heparin bolus of 2000 units then IV heparin rate of 1200 units/hr Check heparin level in 6 hours Daily CBC  Berkley Harvey 12/25/2022,8:47 PM

## 2022-12-25 NOTE — ED Triage Notes (Signed)
POV seen at urgent care today for left side rib pain, EKG and CXR negative, elevated D dimer sent for further evaluation

## 2022-12-25 NOTE — Progress Notes (Signed)
Lower extremity venous duplex completed. Please see CV Procedures for preliminary results.  Initial findings reported to Glyn Ade, MD.  Shona Simpson, RVT 12/25/22 7:40 PM

## 2022-12-26 ENCOUNTER — Other Ambulatory Visit (HOSPITAL_COMMUNITY): Payer: Self-pay

## 2022-12-26 ENCOUNTER — Inpatient Hospital Stay (HOSPITAL_COMMUNITY): Payer: BC Managed Care – PPO

## 2022-12-26 DIAGNOSIS — M069 Rheumatoid arthritis, unspecified: Secondary | ICD-10-CM | POA: Insufficient documentation

## 2022-12-26 DIAGNOSIS — R911 Solitary pulmonary nodule: Secondary | ICD-10-CM

## 2022-12-26 DIAGNOSIS — I82411 Acute embolism and thrombosis of right femoral vein: Secondary | ICD-10-CM | POA: Diagnosis not present

## 2022-12-26 DIAGNOSIS — I2693 Single subsegmental pulmonary embolism without acute cor pulmonale: Secondary | ICD-10-CM

## 2022-12-26 DIAGNOSIS — I2699 Other pulmonary embolism without acute cor pulmonale: Secondary | ICD-10-CM | POA: Diagnosis not present

## 2022-12-26 HISTORY — DX: Solitary pulmonary nodule: R91.1

## 2022-12-26 HISTORY — DX: Rheumatoid arthritis, unspecified: M06.9

## 2022-12-26 LAB — BASIC METABOLIC PANEL
Anion gap: 11 (ref 5–15)
BUN: 7 mg/dL (ref 6–20)
CO2: 18 mmol/L — ABNORMAL LOW (ref 22–32)
Calcium: 8.8 mg/dL — ABNORMAL LOW (ref 8.9–10.3)
Chloride: 107 mmol/L (ref 98–111)
Creatinine, Ser: 0.74 mg/dL (ref 0.44–1.00)
GFR, Estimated: >60 mL/min (ref >60)
Glucose, Bld: 102 mg/dL — ABNORMAL HIGH (ref 70–99)
Potassium: 3.5 mmol/L (ref 3.5–5.1)
Sodium: 136 mmol/L (ref 135–145)

## 2022-12-26 LAB — CBC
HCT: 34.3 % — ABNORMAL LOW (ref 36.0–46.0)
Hemoglobin: 11.8 g/dL — ABNORMAL LOW (ref 12.0–15.0)
MCH: 32.3 pg (ref 26.0–34.0)
MCHC: 34.4 g/dL (ref 30.0–36.0)
MCV: 94 fL (ref 80.0–100.0)
Platelets: 357 10*3/uL (ref 150–400)
RBC: 3.65 MIL/uL — ABNORMAL LOW (ref 3.87–5.11)
RDW: 12.2 % (ref 11.5–15.5)
WBC: 12.5 10*3/uL — ABNORMAL HIGH (ref 4.0–10.5)
nRBC: 0 % (ref 0.0–0.2)

## 2022-12-26 LAB — ECHOCARDIOGRAM COMPLETE
AR max vel: 2.79 cm2
AV Area VTI: 2.47 cm2
AV Area mean vel: 2.12 cm2
AV Mean grad: 4 mm[Hg]
AV Peak grad: 6.1 mm[Hg]
Ao pk vel: 1.23 m/s
Area-P 1/2: 3.91 cm2
Height: 65 in
S' Lateral: 3.2 cm
Weight: 2880 [oz_av]

## 2022-12-26 LAB — HEPARIN LEVEL (UNFRACTIONATED)
Heparin Unfractionated: 0.42 [IU]/mL (ref 0.30–0.70)
Heparin Unfractionated: 0.42 [IU]/mL (ref 0.30–0.70)

## 2022-12-26 LAB — MAGNESIUM: Magnesium: 2.3 mg/dL (ref 1.7–2.4)

## 2022-12-26 LAB — HIV ANTIBODY (ROUTINE TESTING W REFLEX): HIV Screen 4th Generation wRfx: NONREACTIVE

## 2022-12-26 LAB — PHOSPHORUS: Phosphorus: 3 mg/dL (ref 2.5–4.6)

## 2022-12-26 MED ORDER — OXYCODONE HCL 5 MG PO TABS
5.0000 mg | ORAL_TABLET | ORAL | Status: DC | PRN
  Administered 2022-12-26 (×2): 5 mg via ORAL
  Filled 2022-12-26 (×3): qty 1

## 2022-12-26 MED ORDER — TRAMADOL HCL 50 MG PO TABS
50.0000 mg | ORAL_TABLET | Freq: Four times a day (QID) | ORAL | Status: DC | PRN
  Administered 2022-12-26: 50 mg via ORAL
  Filled 2022-12-26: qty 1

## 2022-12-26 MED ORDER — POLYETHYLENE GLYCOL 3350 17 G PO PACK
17.0000 g | PACK | Freq: Every day | ORAL | Status: DC
  Administered 2022-12-26: 17 g via ORAL
  Filled 2022-12-26 (×2): qty 1

## 2022-12-26 MED ORDER — OXYCODONE HCL 5 MG PO TABS: 2.5000 mg | ORAL_TABLET | ORAL | Status: DC | PRN

## 2022-12-26 MED ORDER — SODIUM CHLORIDE 0.9 % IV SOLN
8.0000 mg | Freq: Four times a day (QID) | INTRAVENOUS | Status: DC | PRN
  Administered 2022-12-26: 8 mg via INTRAVENOUS
  Filled 2022-12-26: qty 4

## 2022-12-26 MED ORDER — ONDANSETRON HCL 4 MG/2ML IJ SOLN
4.0000 mg | Freq: Four times a day (QID) | INTRAMUSCULAR | Status: DC | PRN
  Administered 2022-12-26: 4 mg via INTRAVENOUS
  Filled 2022-12-26: qty 2

## 2022-12-26 NOTE — Progress Notes (Signed)
  Echocardiogram 2D Echocardiogram has been performed.  Marissa Marshall 12/26/2022, 4:03 PM

## 2022-12-26 NOTE — TOC CM/SW Note (Signed)
Transition of Care [facility]) - Inpatient Brief Assessment   Patient Details  Name: Marissa Marshall MRN: [id number] Date of Birth: Oct 05, 1965  [facility] CM/SW Contact:    Howell Rucks, RN Phone Number: 12/26/2022, 10:34 AM   Clinical Narrative: Met with pt and spouse at bedside to introduce role of TOC/NCM and review for dc planning, pt reports she has an established PCP and pharmacy, no current home care services for home DME, reports she feels safe returning home with support from her spouse, confirmed transportation available at discharge. TOC Brief Assessment completed. No TOC needs identified at this time.     Transition of Care Asessment: Insurance and Status: Insurance coverage has been reviewed Patient has primary care physician: Yes Home environment has been reviewed: resides in private residence with spouse Prior level of function:: Independent Prior/Current Home Services: No current home services Social Determinants of Health Reivew: SDOH reviewed no interventions necessary Readmission risk has been reviewed: Yes Transition of care needs: no transition of care needs at this time

## 2022-12-26 NOTE — Progress Notes (Signed)
PHARMACY - ANTICOAGULATION CONSULT NOTE  Pharmacy Consult for heparin  Indication: acute pulmonary embolus and DVT  Allergies  Allergen Reactions   Ciprofloxacin Other (See Comments)    Severe joint pain   Tetracyclines & Related Other (See Comments)    Bad headaches   Sulfa Antibiotics Rash and Other (See Comments)    Bad rash    Patient Measurements: Height: 5\' 5"  (165.1 cm) Weight: 81.6 kg (180 lb) IBW/kg (Calculated) : 57 Heparin Dosing Weight: 74 kg  Vital Signs: Temp: 98.4 F (36.9 C) (10/25 0634) Temp Source: Oral (10/25 0634) BP: 99/70 (10/25 0634) Pulse Rate: 95 (10/25 0634)  Labs: Recent Labs    12/25/22 1545 12/25/22 1645 12/25/22 1652 12/26/22 0304  HGB 11.7*  --   --  11.8*  HCT 33.9*  --   --  34.3*  PLT 317  --   --  357  HEPARINUNFRC  --   --   --  0.42  CREATININE 0.86  --  0.80 0.74  TROPONINIHS <2 <2  --   --     Estimated Creatinine Clearance: 83.8 mL/min (by C-G formula based on SCr of 0.74 mg/dL).   Medical History: Past Medical History:  Diagnosis Date   Allergy    ASCUS (atypical squamous cells of undetermined significance) on Pap smear    Left lower quadrant pain     Assessment: Patient is a 57 y.o F who presented to Urgent Care on 12/25/22 with c/o left rib pain and was found to have elevated D-dimer. She was then sent to the ED for further workup.  Chest CT done in the ED on 12/25/22 showed acute LLL PE without evidence of RHS and findings with concern for possible pulmonary infarct.  LE doppler also resulted back with acute RLE DVT.  She's currently on heparin drip for VTE treatment.  Today, 12/26/2022: - heparin level collected at 9a remains therapeutic at 0.42 - cbc stable - no bleeding documented   Goal of Therapy:  Heparin level 0.3-0.7 units/ml Monitor platelets by anticoagulation protocol: Yes   Plan:  - continue heparin drip at 1200 units/hr - daily heparin level - monitor for s/sx bleeding   Kippy Gohman  P 12/26/2022,8:27 AM

## 2022-12-26 NOTE — TOC Benefit Eligibility Note (Signed)
Pharmacy Patient Advocate Encounter  Insurance verification completed.    The patient is insured through [facility]. Patient has [employer], may use a copay card, and/or apply for patient assistance if available.    Ran test claim for Xarelto and the current 30 day co-pay is $25.00.  Ran test claim for Eliquis and the current 30 day co-pay is $25.00.  This test claim was processed through [facility]- copay amounts may vary at other pharmacies due to pharmacy/plan contracts, or as the patient moves through the different stages of their insurance plan.

## 2022-12-26 NOTE — Progress Notes (Signed)
PHARMACY - ANTICOAGULATION CONSULT NOTE  Pharmacy Consult for IV heparin Indication: pulmonary embolus  Allergies  Allergen Reactions   Ciprofloxacin Other (See Comments)    Severe joint pain   Tetracyclines & Related Other (See Comments)    Bad headaches   Sulfa Antibiotics Rash and Other (See Comments)    Bad rash    Patient Measurements: Height: 5\' 5"  (165.1 cm) Weight: 81.6 kg (180 lb) IBW/kg (Calculated) : 57 Heparin Dosing Weight: 74 kg  Vital Signs: Temp: 98 F (36.7 C) (10/24 2331) Temp Source: Oral (10/24 2331) BP: 123/74 (10/25 0315) Pulse Rate: 100 (10/25 0315)  Labs: Recent Labs    12/25/22 1545 12/25/22 1645 12/25/22 1652 12/26/22 0304  HGB 11.7*  --   --  11.8*  HCT 33.9*  --   --  34.3*  PLT 317  --   --  357  HEPARINUNFRC  --   --   --  0.42  CREATININE 0.86  --  0.80 0.74  TROPONINIHS <2 <2  --   --     Estimated Creatinine Clearance: 83.8 mL/min (by C-G formula based on SCr of 0.74 mg/dL).   Medical History: Past Medical History:  Diagnosis Date   Allergy    ASCUS (atypical squamous cells of undetermined significance) on Pap smear    Left lower quadrant pain     Medications:  Scheduled:   folic acid  0.5 mg Oral Daily   pantoprazole  40 mg Oral Daily   sodium chloride flush  3 mL Intravenous Q12H   Infusions:   heparin 1,200 Units/hr (12/25/22 2134)    Assessment: 57 yo with elevated d-dimer presented to rule out DVT ([place] pending) and PE. Patient found to have PE to start IV heparin. Baseline labs obtained. Not on any anticoagulants prior to admission  12/26/22 Heparin level = 0.42 (therapeutic) with heparin gtt @ 1200 units/hr Hgb = 11.8, Pltc wnl No complications of therapy noted  Goal of Therapy:  Heparin level 0.3-0.7 units/ml Monitor platelets by anticoagulation protocol: Yes   Plan:  Continue IV heparin @ 1200 units/hr Recheck heparin level in 6 hours to confirm therapeutic dose Daily CBC  Emerald Gehres, Joselyn Glassman,  PharmD 12/26/2022,4:04 AM

## 2022-12-26 NOTE — Plan of Care (Signed)

## 2022-12-26 NOTE — Progress Notes (Signed)
[facility] PROGRESS NOTE    Progress Note  Marissa Marshall  BJY:[id number] DOB: May 26, 1965 DOA: 12/25/2022 PCP: Gweneth Dimitri, MD     Brief Narrative:   Marissa Marshall is an 57 y.o. female past medical history of rheumatoid arthritis, perimenopausal on hormonal replacement therapy comes into the ED for acute left-sided chest pain that started 1 day prior to admission D-dimer was elevated CT angio of the chest was done that showed diffuse left lower lobe emboli without evidence of right heart strain.  With possible changes of pulmonary infarct there is a right 3 mm solid nodule needs to be followed up as an outpatient.   Assessment/Plan:   Acute left-sided pulmonary embolism on left [facility]) With extensive clot burden and early pulmonary infarct Lower extremity Doppler showed DVT. In the setting of estrogen containing vaginal cream. Started on IV heparin, 2D echo is pending to evaluate for right heart strain. Replacement hormonal therapy has been discontinued she has been informed. Avoid NSAIDs.  Increase narcotics started on a bowel regimen  Incidental pulmonary nodule, > 3mm and < 8mm Will need to follow-up CT in 6 to 12 months an outpatient. Question of related to rheumatoid arthritis  Rheumatoid arthritis (HCC) Continue methotrexate weekly.   DVT prophylaxis: heparin Family Communication:none Status is: Inpatient Remains inpatient appropriate because: Acute PE    Code Status:     Code Status Orders  (From admission, onward)           Start     Ordered   12/25/22 2039  Full code  Continuous       Question:  By:  Answer:  Consent: discussion documented in EHR   12/25/22 2040           Code Status History     This patient has a current code status but no historical code status.         IV Access:   Peripheral IV   Procedures and diagnostic studies:   VAS [place] LOWER EXTREMITY VENOUS (DVT) (7a-7p)  Result Date: 12/25/2022  Lower Venous DVT  Study Patient Name:  Marissa Marshall  Date of Exam:   12/25/2022 Medical Rec #: [id number]      Accession #:    [id number] Date of Birth: 03-Dec-1965      Patient Gender: F Patient Age:   17 years Exam Location:  [facility] Procedure:      VAS [place] LOWER EXTREMITY VENOUS (DVT) Referring Phys: Arabella Merles --------------------------------------------------------------------------------  Indications: PE in Hx.  Risk Factors: None identified. Comparison Study: No prior study Performing Technologist: Shona Simpson  Examination Guidelines: A complete evaluation includes B-mode imaging, spectral Doppler, color Doppler, and power Doppler as needed of all accessible portions of each vessel. Bilateral testing is considered an integral part of a complete examination. Limited examinations for reoccurring indications may be performed as noted. The reflux portion of the exam is performed with the patient in reverse Trendelenburg.  +---------+---------------+---------+-----------+---------------+-------------+ RIGHT    CompressibilityPhasicitySpontaneityProperties     Thrombus                                                                 Aging         +---------+---------------+---------+-----------+---------------+-------------+ CFV      Partial  Yes      Yes        brightly       Acute                                                     echogenic                    +---------+---------------+---------+-----------+---------------+-------------+ SFJ      Full                                                            +---------+---------------+---------+-----------+---------------+-------------+ FV Prox  Full                                                            +---------+---------------+---------+-----------+---------------+-------------+ FV Mid   Full                                                             +---------+---------------+---------+-----------+---------------+-------------+ FV DistalFull                                                            +---------+---------------+---------+-----------+---------------+-------------+ PFV      None           No       No         brightly       Acute                                                     echogenic                    +---------+---------------+---------+-----------+---------------+-------------+ POP      Full           Yes      Yes                                     +---------+---------------+---------+-----------+---------------+-------------+ PTV      Full                                                            +---------+---------------+---------+-----------+---------------+-------------+ PERO  Full                                                            +---------+---------------+---------+-----------+---------------+-------------+   +---------+---------------+---------+-----------+----------+--------------+ LEFT     CompressibilityPhasicitySpontaneityPropertiesThrombus Aging +---------+---------------+---------+-----------+----------+--------------+ CFV      Full           Yes      Yes                                 +---------+---------------+---------+-----------+----------+--------------+ SFJ      Full                                                        +---------+---------------+---------+-----------+----------+--------------+ FV Prox  Full                                                        +---------+---------------+---------+-----------+----------+--------------+ FV Mid   Full                                                        +---------+---------------+---------+-----------+----------+--------------+ FV DistalFull                                                        +---------+---------------+---------+-----------+----------+--------------+  PFV      Full                                                        +---------+---------------+---------+-----------+----------+--------------+ POP      Full           Yes      Yes                                 +---------+---------------+---------+-----------+----------+--------------+ PTV      Full                                                        +---------+---------------+---------+-----------+----------+--------------+ PERO     Full                                                        +---------+---------------+---------+-----------+----------+--------------+  Summary: RIGHT: - Findings consistent with acute deep vein thrombosis involving the right common femoral vein, and right proximal profunda vein.  - No cystic structure found in the popliteal fossa.  LEFT: - There is no evidence of deep vein thrombosis in the lower extremity.  - No cystic structure found in the popliteal fossa.  *See table(s) above for measurements and observations.    Preliminary    CT Angio Chest PE W and/or Wo Contrast  Result Date: 12/25/2022 CLINICAL DATA:  Positive D-dimer and chest pain on the left, initial encounter EXAM: CT ANGIOGRAPHY CHEST WITH CONTRAST TECHNIQUE: Multidetector CT imaging of the chest was performed using the standard protocol during bolus administration of intravenous contrast. Multiplanar CT image reconstructions and MIPs were obtained to evaluate the vascular anatomy. RADIATION DOSE REDUCTION: This exam was performed according to the departmental dose-optimization program which includes automated exposure control, adjustment of the mA and/or kV according to patient size and/or use of iterative reconstruction technique. CONTRAST:  80mL OMNIPAQUE IOHEXOL 350 MG/ML SOLN COMPARISON:  None Available. FINDINGS: Cardiovascular: Thoracic aorta shows no aneurysmal dilatation or dissection. Heart is not significantly enlarged in size. Pulmonary artery is well visualized and  demonstrates a normal branching pattern bilaterally. Extensive left-sided central lobar and left lower lobe pulmonary emboli are noted. No evidence of right heart strain is seen. No significant emboli are noted on the right. Mediastinum/Nodes: Thoracic inlet is within normal limits. No hilar or mediastinal adenopathy is noted. The esophagus as visualized is within normal limits. Lungs/Pleura: Mild right basilar atelectasis is seen. A 3 mm noncalcified nodule is noted in the right lower lobe. This is best visualized on image number 115 of series 12. No other nodules are seen within the right lung. The left lung is well aerated with lower lobe airspace opacity likely related to the known pulmonary emboli. Upper Abdomen: Visualized upper abdomen is unremarkable. Musculoskeletal: No chest wall abnormality. No acute or significant osseous findings. Review of the MIP images confirms the above findings. IMPRESSION: Diffuse left lower lobe pulmonary emboli without evidence of right heart strain. Changes in the left lower lobe likely related to the central pulmonary emboli and possibly representing early changes of pulmonary infarct. Right solid pulmonary nodule measuring 3 mm. Per Fleischner Society Guidelines, no routine follow-up imaging is recommended. These guidelines do not apply to immunocompromised patients and patients with cancer. Follow up in patients with significant comorbidities as clinically warranted. For lung cancer screening, adhere to Lung-RADS guidelines. Reference: Radiology. 2017; 284(1):228-43. Critical Value/emergent results were called by telephone at the time of interpretation on 12/25/2022 at 6:40 pm to [facility], who verbally acknowledged these results. Electronically Signed   By: Alcide Clever M.D.   On: 12/25/2022 18:41     Medical Consultants:   None.   Subjective:    Marissa Marshall relates she still in pain  Objective:    Vitals:   12/26/22 0500 12/26/22 0600 12/26/22  0612 12/26/22 0634  BP: 115/78 107/71  99/70  Pulse: 96 90  95  Resp: 19 18  20   Temp:    98.4 F (36.9 C)  TempSrc:    Oral  SpO2: 96% 92% 95% 99%  Weight:      Height:       SpO2: 99 %  No intake or output data in the 24 hours ending 12/26/22 0749 Filed Weights   12/25/22 2039 12/25/22 2046  Weight: 81.6 kg 81.6 kg    Exam: General exam: In no acute  distress. Respiratory system: Good air movement and clear to auscultation. Cardiovascular system: S1 & S2 heard, RRR. No JVD.  Gastrointestinal system: Abdomen is nondistended, soft and nontender.  Extremities: No pedal edema. Skin: No rashes, lesions or ulcers Psychiatry: Judgement and insight appear normal. Mood & affect appropriate.    Data Reviewed:    Labs: Basic Metabolic Panel: Recent Labs  Lab 12/25/22 1545 12/25/22 1652 12/26/22 0304  NA 135  --  136  K 3.5  --  3.5  CL 105  --  107  CO2 22  --  18*  GLUCOSE 111*  --  102*  BUN 11  --  7  CREATININE 0.86 0.80 0.74  CALCIUM 8.8*  --  8.8*  MG  --   --  2.3  PHOS  --   --  3.0   GFR Estimated Creatinine Clearance: 83.8 mL/min (by C-G formula based on SCr of 0.74 mg/dL). Liver Function Tests: Recent Labs  Lab 12/25/22 1545  AST 17  ALT 11  ALKPHOS 52  BILITOT 0.7  PROT 7.1  ALBUMIN 3.4*   No results for input(s): "LIPASE", "AMYLASE" in the last 168 hours. No results for input(s): "AMMONIA" in the last 168 hours. Coagulation profile No results for input(s): "INR", "PROTIME" in the last 168 hours. COVID-19 Labs  No results for input(s): "DDIMER", "FERRITIN", "LDH", "CRP" in the last 72 hours.  No results found for: "SARSCOV2NAA"  CBC: Recent Labs  Lab 12/25/22 1545 12/26/22 0304  WBC 10.9* 12.5*  NEUTROABS 9.8*  --   HGB 11.7* 11.8*  HCT 33.9* 34.3*  MCV 93.9 94.0  PLT 317 357   Cardiac Enzymes: No results for input(s): "CKTOTAL", "CKMB", "CKMBINDEX", "TROPONINI" in the last 168 hours. BNP (last 3 results) No results for  input(s): "PROBNP" in the last 8760 hours. CBG: No results for input(s): "GLUCAP" in the last 168 hours. D-Dimer: No results for input(s): "DDIMER" in the last 72 hours. Hgb A1c: No results for input(s): "HGBA1C" in the last 72 hours. Lipid Profile: No results for input(s): "CHOL", "HDL", "LDLCALC", "TRIG", "CHOLHDL", "LDLDIRECT" in the last 72 hours. Thyroid function studies: No results for input(s): "TSH", "T4TOTAL", "T3FREE", "THYROIDAB" in the last 72 hours.  Invalid input(s): "FREET3" Anemia work up: No results for input(s): "VITAMINB12", "FOLATE", "FERRITIN", "TIBC", "IRON", "RETICCTPCT" in the last 72 hours. Sepsis Labs: Recent Labs  Lab 12/25/22 1545 12/26/22 0304  WBC 10.9* 12.5*   Microbiology No results found for this or any previous visit (from the past 240 hour(s)).   Medications:    folic acid  0.5 mg Oral Daily   pantoprazole  40 mg Oral Daily   sodium chloride flush  3 mL Intravenous Q12H   Continuous Infusions:  heparin 1,200 Units/hr (12/25/22 2134)      LOS: 1 day   Marinda Elk  [facility]  12/26/2022, 7:49 AM

## 2022-12-26 NOTE — ED Notes (Signed)
ED TO INPATIENT HANDOFF REPORT  ED Nurse Name and Phone #: Rebecca Eaton RN  S Name/Age/Gender Nena Polio 57 y.o. female Room/Bed: WA07/WA07  Code Status   Code Status: Full Code  Home/SNF/Other Home Patient oriented to: self, place, time, and situation Is this baseline? Yes   Triage Complete: Triage complete  Chief Complaint Pulmonary embolism on left [facility]) [I26.99]  Triage Note POV seen at urgent care today for left side rib pain, EKG and CXR negative, elevated D dimer sent for further evaluation   Allergies Allergies  Allergen Reactions   Ciprofloxacin Other (See Comments)    Severe joint pain   Tetracyclines & Related Other (See Comments)    Bad headaches   Sulfa Antibiotics Rash and Other (See Comments)    Bad rash    Level of Care/Admitting Diagnosis ED Disposition     ED Disposition  Admit   Condition  --   Comment  Hospital Area: [facility] [[id number]]  Level of Care: Progressive [102]  Admit to Progressive based on following criteria: CARDIOVASCULAR & THORACIC of moderate stability with acute coronary syndrome symptoms/low risk myocardial infarction/hypertensive urgency/arrhythmias/heart failure potentially compromising stability and stable post cardiovascular intervention patients.  May admit patient to [facility] or [facility] if equivalent level of care is available:: No  Covid Evaluation: Asymptomatic - no recent exposure (last 10 days) testing not required  Diagnosis: Pulmonary embolism on left [facility]) [[id number]]  Admitting Physician: Dolly Rias [[id number]]  Attending Physician: Dolly Rias [[id number]]  Certification:: I certify this patient will need inpatient services for at least 2 midnights  Expected Medical Readiness: 12/27/2022          B Medical/Surgery History Past Medical History:  Diagnosis Date   Allergy    ASCUS (atypical squamous cells of undetermined significance) on Pap smear    Left lower quadrant  pain    Past Surgical History:  Procedure Laterality Date   BREAST BIOPSY Left 11/05/2022   MM LT BREAST BX W LOC DEV EA AD LESION IMG BX SPEC STEREO GUIDE 11/05/2022 [facility]   BREAST BIOPSY Left 11/05/2022   MM LT BREAST BX W LOC DEV 1ST LESION IMAGE BX SPEC STEREO GUIDE 11/05/2022 [facility]   NO PAST SURGERIES       A IV Location/Drains/Wounds Patient Lines/Drains/Airways Status     Active Line/Drains/Airways     Name Placement date Placement time Site Days   Peripheral IV 12/25/22 20 G 1.88" Right Antecubital 12/25/22  1544  Antecubital  1            Intake/Output Last 24 hours No intake or output data in the 24 hours ending 12/26/22 0527  Labs/Imaging Results for orders placed or performed during the hospital encounter of 12/25/22 (from the past 48 hour(s))  CBC with Differential     Status: Abnormal   Collection Time: 12/25/22  3:45 PM  Result Value Ref Range   WBC 10.9 (H) 4.0 - 10.5 K/uL   RBC 3.61 (L) 3.87 - 5.11 MIL/uL   Hemoglobin 11.7 (L) 12.0 - 15.0 g/dL   HCT 40.3 (L) 47.4 - 25.9 %   MCV 93.9 80.0 - 100.0 fL   MCH 32.4 26.0 - 34.0 pg   MCHC 34.5 30.0 - 36.0 g/dL   RDW 56.3 87.5 - 64.3 %   Platelets 317 150 - 400 K/uL   nRBC 0.0 0.0 - 0.2 %   Neutrophils Relative % 89 %   Neutro Abs 9.8 (H) 1.7 -  7.7 K/uL   Lymphocytes Relative 6 %   Lymphs Abs 0.6 (L) 0.7 - 4.0 K/uL   Monocytes Relative 4 %   Monocytes Absolute 0.4 0.1 - 1.0 K/uL   Eosinophils Relative 0 %   Eosinophils Absolute 0.0 0.0 - 0.5 K/uL   Basophils Relative 0 %   Basophils Absolute 0.0 0.0 - 0.1 K/uL   Immature Granulocytes 1 %   Abs Immature Granulocytes 0.05 0.00 - 0.07 K/uL    Comment: Performed at [facility], [address] [place], [place] [address]  Comprehensive metabolic panel     Status: Abnormal   Collection Time: 12/25/22  3:45 PM  Result Value Ref Range   Sodium 135 135 - 145 mmol/L   Potassium 3.5 3.5 - 5.1 mmol/L   Chloride 105 98 -  111 mmol/L   CO2 22 22 - 32 mmol/L   Glucose, Bld 111 (H) 70 - 99 mg/dL    Comment: Glucose reference range applies only to samples taken after fasting for at least 8 hours.   BUN 11 6 - 20 mg/dL   Creatinine, Ser 6.04 0.44 - 1.00 mg/dL   Calcium 8.8 (L) 8.9 - 10.3 mg/dL   Total Protein 7.1 6.5 - 8.1 g/dL   Albumin 3.4 (L) 3.5 - 5.0 g/dL   AST 17 15 - 41 U/L   ALT 11 0 - 44 U/L   Alkaline Phosphatase 52 38 - 126 U/L   Total Bilirubin 0.7 0.3 - 1.2 mg/dL   GFR, Estimated >54 >09 mL/min    Comment: (NOTE) Calculated using the CKD-EPI Creatinine Equation (2021)    Anion gap 8 5 - 15    Comment: Performed at [facility], [address], [place], [place] [address]  Troponin I (High Sensitivity)     Status: None   Collection Time: 12/25/22  3:45 PM  Result Value Ref Range   Troponin I (High Sensitivity) <2 <18 ng/L    Comment: (NOTE) Elevated high sensitivity troponin I (hsTnI) values and significant  changes across serial measurements may suggest ACS but many other  chronic and acute conditions are known to elevate hsTnI results.  Refer to the "Links" section for chest pain algorithms and additional  guidance. Performed at [facility], [address], [place], [place] [address]   Brain natriuretic peptide     Status: None   Collection Time: 12/25/22  4:45 PM  Result Value Ref Range   B Natriuretic Peptide 20.9 0.0 - 100.0 pg/mL    Comment: Performed at [facility], [address] [place], [place] [address]  Troponin I (High Sensitivity)     Status: None   Collection Time: 12/25/22  4:45 PM  Result Value Ref Range   Troponin I (High Sensitivity) <2 <18 ng/L    Comment: (NOTE) Elevated high sensitivity troponin I (hsTnI) values and significant  changes across serial measurements may suggest ACS but many other  chronic and acute conditions are known to elevate hsTnI results.  Refer to the "Links" section for chest pain  algorithms and additional  guidance. Performed at [facility], [address], [place], [place] [address]   I-stat Creatinine, ED     Status: None   Collection Time: 12/25/22  4:52 PM  Result Value Ref Range   Creatinine, Ser 0.80 0.44 - 1.00 mg/dL  hCG, serum, qualitative     Status: None   Collection Time: 12/25/22  9:50 PM  Result Value Ref Range   Preg, Serum NEGATIVE NEGATIVE    Comment:        THE SENSITIVITY OF THIS METHODOLOGY IS >10 mIU/mL. Performed at [facility], [address], [place], [place] [address]   Basic metabolic panel     Status: Abnormal   Collection Time: 12/26/22  3:04 AM  Result Value Ref Range   Sodium 136 135 - 145 mmol/L   Potassium 3.5 3.5 - 5.1 mmol/L   Chloride 107 98 - 111 mmol/L   CO2 18 (L) 22 - 32 mmol/L   Glucose, Bld 102 (H) 70 - 99 mg/dL    Comment: Glucose reference range applies only to samples taken after fasting for at least 8 hours.   BUN 7 6 - 20 mg/dL   Creatinine, Ser 3.08 0.44 - 1.00 mg/dL   Calcium 8.8 (L) 8.9 - 10.3 mg/dL   GFR, Estimated >65 >78 mL/min    Comment: (NOTE) Calculated using the CKD-EPI Creatinine Equation (2021)    Anion gap 11 5 - 15    Comment: Performed at [facility], [address] [place], [place] [address]  CBC     Status: Abnormal   Collection Time: 12/26/22  3:04 AM  Result Value Ref Range   WBC 12.5 (H) 4.0 - 10.5 K/uL   RBC 3.65 (L) 3.87 - 5.11 MIL/uL   Hemoglobin 11.8 (L) 12.0 - 15.0 g/dL   HCT 95.2 (L) 84.1 - 32.4 %   MCV 94.0 80.0 - 100.0 fL   MCH 32.3 26.0 - 34.0 pg   MCHC 34.4 30.0 - 36.0 g/dL   RDW 40.1 02.7 - 25.3 %   Platelets 357 150 - 400 K/uL   nRBC 0.0 0.0 - 0.2 %    Comment: Performed at [facility], [address] [place], [place] [address]  Magnesium     Status: None   Collection Time: 12/26/22  3:04 AM  Result Value Ref Range   Magnesium 2.3 1.7 - 2.4 mg/dL    Comment: Performed at [facility], [address], [place], [place] [address]  Phosphorus     Status: None   Collection Time: 12/26/22  3:04 AM  Result Value Ref Range   Phosphorus 3.0 2.5 - 4.6 mg/dL    Comment: Performed at [facility], [address], [place], [place] [address]  Heparin level (unfractionated)     Status: None   Collection Time: 12/26/22  3:04 AM  Result Value Ref Range   Heparin Unfractionated 0.42 0.30 - 0.70 IU/mL    Comment: (NOTE) The clinical reportable range upper limit is being lowered to >1.10 to align with the FDA approved guidance for the current laboratory assay.  If heparin results are below expected values, and patient dosage has  been confirmed, suggest follow up testing of antithrombin III levels. Performed at [facility], [address] [place], [place] [address]    VAS [place] LOWER EXTREMITY VENOUS (DVT) (7a-7p)  Result Date: 12/25/2022  Lower Venous DVT Study Patient Name:  LORINDA SARAGOZA  Date of Exam:   12/25/2022 Medical Rec #: [id number]      Accession #:    [id number] Date of Birth: August 27, 1965      Patient Gender: F Patient Age:   44 years Exam Location:  [facility] Procedure:      VAS [place] LOWER EXTREMITY VENOUS (DVT) Referring Phys: Arabella Merles --------------------------------------------------------------------------------  Indications: PE  in Hx.  Risk Factors: None identified. Comparison Study: No prior study Performing Technologist: Shona Simpson  Examination Guidelines: A complete evaluation includes B-mode imaging, spectral Doppler, color Doppler, and power Doppler as needed of all accessible portions of each vessel. Bilateral testing is considered an integral part of a complete examination. Limited examinations for reoccurring indications may be performed as noted. The reflux portion of the exam is performed with the patient in reverse Trendelenburg.   +---------+---------------+---------+-----------+---------------+-------------+ RIGHT    CompressibilityPhasicitySpontaneityProperties     Thrombus                                                                 Aging         +---------+---------------+---------+-----------+---------------+-------------+ CFV      Partial        Yes      Yes        brightly       Acute                                                     echogenic                    +---------+---------------+---------+-----------+---------------+-------------+ SFJ      Full                                                            +---------+---------------+---------+-----------+---------------+-------------+ FV Prox  Full                                                            +---------+---------------+---------+-----------+---------------+-------------+ FV Mid   Full                                                            +---------+---------------+---------+-----------+---------------+-------------+ FV DistalFull                                                            +---------+---------------+---------+-----------+---------------+-------------+ PFV      None           No       No         brightly       Acute  echogenic                    +---------+---------------+---------+-----------+---------------+-------------+ POP      Full           Yes      Yes                                     +---------+---------------+---------+-----------+---------------+-------------+ PTV      Full                                                            +---------+---------------+---------+-----------+---------------+-------------+ PERO     Full                                                            +---------+---------------+---------+-----------+---------------+-------------+    +---------+---------------+---------+-----------+----------+--------------+ LEFT     CompressibilityPhasicitySpontaneityPropertiesThrombus Aging +---------+---------------+---------+-----------+----------+--------------+ CFV      Full           Yes      Yes                                 +---------+---------------+---------+-----------+----------+--------------+ SFJ      Full                                                        +---------+---------------+---------+-----------+----------+--------------+ FV Prox  Full                                                        +---------+---------------+---------+-----------+----------+--------------+ FV Mid   Full                                                        +---------+---------------+---------+-----------+----------+--------------+ FV DistalFull                                                        +---------+---------------+---------+-----------+----------+--------------+ PFV      Full                                                        +---------+---------------+---------+-----------+----------+--------------+ POP      Full  Yes      Yes                                 +---------+---------------+---------+-----------+----------+--------------+ PTV      Full                                                        +---------+---------------+---------+-----------+----------+--------------+ PERO     Full                                                        +---------+---------------+---------+-----------+----------+--------------+    Summary: RIGHT: - Findings consistent with acute deep vein thrombosis involving the right common femoral vein, and right proximal profunda vein.  - No cystic structure found in the popliteal fossa.  LEFT: - There is no evidence of deep vein thrombosis in the lower extremity.  - No cystic structure found in the popliteal fossa.  *See table(s) above  for measurements and observations.    Preliminary    CT Angio Chest PE W and/or Wo Contrast  Result Date: 12/25/2022 CLINICAL DATA:  Positive D-dimer and chest pain on the left, initial encounter EXAM: CT ANGIOGRAPHY CHEST WITH CONTRAST TECHNIQUE: Multidetector CT imaging of the chest was performed using the standard protocol during bolus administration of intravenous contrast. Multiplanar CT image reconstructions and MIPs were obtained to evaluate the vascular anatomy. RADIATION DOSE REDUCTION: This exam was performed according to the departmental dose-optimization program which includes automated exposure control, adjustment of the mA and/or kV according to patient size and/or use of iterative reconstruction technique. CONTRAST:  80mL OMNIPAQUE IOHEXOL 350 MG/ML SOLN COMPARISON:  None Available. FINDINGS: Cardiovascular: Thoracic aorta shows no aneurysmal dilatation or dissection. Heart is not significantly enlarged in size. Pulmonary artery is well visualized and demonstrates a normal branching pattern bilaterally. Extensive left-sided central lobar and left lower lobe pulmonary emboli are noted. No evidence of right heart strain is seen. No significant emboli are noted on the right. Mediastinum/Nodes: Thoracic inlet is within normal limits. No hilar or mediastinal adenopathy is noted. The esophagus as visualized is within normal limits. Lungs/Pleura: Mild right basilar atelectasis is seen. A 3 mm noncalcified nodule is noted in the right lower lobe. This is best visualized on image number 115 of series 12. No other nodules are seen within the right lung. The left lung is well aerated with lower lobe airspace opacity likely related to the known pulmonary emboli. Upper Abdomen: Visualized upper abdomen is unremarkable. Musculoskeletal: No chest wall abnormality. No acute or significant osseous findings. Review of the MIP images confirms the above findings. IMPRESSION: Diffuse left lower lobe pulmonary emboli  without evidence of right heart strain. Changes in the left lower lobe likely related to the central pulmonary emboli and possibly representing early changes of pulmonary infarct. Right solid pulmonary nodule measuring 3 mm. Per Fleischner Society Guidelines, no routine follow-up imaging is recommended. These guidelines do not apply to immunocompromised patients and patients with cancer. Follow up in patients with significant comorbidities as clinically warranted. For lung cancer screening, adhere to Lung-RADS guidelines. Reference: Radiology. 2017; 284(1):228-43. Critical Value/emergent  results were called by telephone at the time of interpretation on 12/25/2022 at 6:40 pm to [facility], who verbally acknowledged these results. Electronically Signed   By: Alcide Clever M.D.   On: 12/25/2022 18:41    Pending Labs Unresulted Labs (From admission, onward)     Start     Ordered   12/26/22 0900  Heparin level (unfractionated)  Once-Timed,   TIMED        12/26/22 0404   12/26/22 0500  HIV Antibody (routine testing w rflx)  (HIV Antibody (Routine testing w reflex) panel)  Tomorrow morning,   R        12/25/22 2040   12/26/22 0500  CBC  Daily,   R      12/25/22 2052            Vitals/Pain Today's Vitals   12/26/22 0315 12/26/22 0449 12/26/22 0454 12/26/22 0500  BP: 123/74   115/78  Pulse: 100   96  Resp: 20   19  Temp:   98.4 F (36.9 C)   TempSrc:   Oral   SpO2: 96%   96%  Weight:      Height:      PainSc:  7       Isolation Precautions No active isolations  Medications Medications  sodium chloride flush (NS) 0.9 % injection 3 mL (3 mLs Intravenous Given 12/25/22 2136)  acetaminophen (TYLENOL) tablet 1,000 mg (1,000 mg Oral Given 12/26/22 0337)  oxyCODONE (Oxy IR/ROXICODONE) immediate release tablet 2.5 mg ( Oral See Alternative 12/26/22 0452)    Or  oxyCODONE (Oxy IR/ROXICODONE) immediate release tablet 5 mg (5 mg Oral Given 12/26/22 0452)  heparin ADULT infusion 100  units/mL (25000 units/271mL) (1,200 Units/hr Intravenous New Bag/Given 12/25/22 2134)  alum & mag hydroxide-simeth (MAALOX/MYLANTA) 200-200-20 MG/5ML suspension 30 mL (30 mLs Oral Given 12/25/22 2143)  ALPRAZolam (XANAX) tablet 0.125-0.25 mg (has no administration in time range)  pantoprazole (PROTONIX) EC tablet 40 mg (has no administration in time range)  folic acid (FOLVITE) tablet 0.5 mg (has no administration in time range)  iohexol (OMNIPAQUE) 350 MG/ML injection 80 mL (80 mLs Intravenous Contrast Given 12/25/22 1712)  heparin bolus via infusion 2,000 Units (2,000 Units Intravenous Bolus from Bag 12/25/22 2135)    Mobility walks     Focused Assessments     R Recommendations: See Admitting Provider Note  Report given to:   Additional Notes:  Pt already on hospital bed.

## 2022-12-26 NOTE — H&P (Addendum)
History and Physical    Marissa Marshall ZOX:[id number] DOB: 15-Jan-1966 DOA: 12/25/2022  PCP: Gweneth Dimitri, MD   Patient coming from: Home   Chief Complaint:  Chief Complaint  Patient presents with   Abnormal Lab    HPI:  Marissa Marshall is a 57 y.o. female with hx of rheumatoid arthritis, perimenopausal on Nuvaring,  who was referred to the ED by urgent care with suspicion for PE.  Patient had developed 1 day acute left-sided chest pain described as, "like I broke a rib". Saw urgent care and had elevated Ddimer, referred to ED.  Previous to this had been in her normal state of health.  Denied any recent lower extremity swelling.  No dyspnea.  Pain is pleuritic with deep breaths.  No cough or hemoptysis.  No prior history of VTE, miscarriages. recent plane trip to [place] 2 weeks ago. Does report a family history of clotting disorder with her paternal aunt passing away from what she thinks was a PE. Nonsmoker.     Review of Systems:  ROS complete and negative except as marked above   Allergies  Allergen Reactions   Ciprofloxacin Other (See Comments)    Severe joint pain   Tetracyclines & Related Other (See Comments)    Bad headaches   Sulfa Antibiotics Rash and Other (See Comments)    Bad rash    Prior to Admission medications   Medication Sig Start Date End Date Taking? Authorizing Provider  ALPRAZolam (XANAX) 0.25 MG tablet Take 1 tablet (0.25 mg total) by mouth as needed for anxiety Patient taking differently: Take 0.125-0.25 mg by mouth daily as needed for anxiety or sleep. 04/02/22  Yes   folic acid (FOLVITE) 400 MCG tablet Take 400 mcg by mouth daily.   Yes [provider]  methocarbamol (ROBAXIN) 500 MG tablet Take 500 mg by mouth every 6 (six) hours as needed for muscle spasms.   Yes [provider]  methotrexate (RHEUMATREX) 2.5 MG tablet Take 15 mg by mouth every Wednesday.   Yes [provider]  NON FORMULARY Take 1 tablet by mouth See admin  instructions. CBD gummies- Chew 1-2 gummies by mouth at bedtime as needed for joint pain or sleep   Yes [provider]  predniSONE (DELTASONE) 10 MG tablet Take 10 mg by mouth daily as needed (as directed by provider- take with food).   Yes [provider]  PRILOSEC OTC 20 MG tablet Take 20 mg by mouth daily before breakfast.   Yes [provider]  tirzepatide (ZEPBOUND) 7.5 MG/0.5ML Pen Inject 7.5 mg into the skin once a week. Patient taking differently: Inject 7.5 mg into the skin every Saturday. 11/12/22  Yes   UNKNOWN TO PATIENT Place 1 drop into both eyes See admin instructions. Unnamed prescribed eye drops- Instill 1 drop into each eye once a day as directed/as needed   Yes [provider]  Insulin Pen Needle (TECHLITE PEN NEEDLES) 32G X 4 MM MISC Use one pen needle to inject Saxenda under the skin once daily. 04/14/22     Liraglutide -Weight Management (SAXENDA) 18 MG/3ML SOPN Inject 3 mg into the skin once a day 30 days Patient not taking: Reported on 12/25/2022 09/19/21   Helane Rima, DO  Liraglutide -Weight Management (SAXENDA) 18 MG/3ML SOPN Inject 3mg  (pt to start with 0.6mg  daily) under the skin once daily Patient not taking: Reported on 12/25/2022 06/26/22     Liraglutide -Weight Management (SAXENDA) 18 MG/3ML SOPN Inject 3 mg into the  skin daily. Patient not taking: Reported on 12/25/2022 06/27/22     Phentermine HCl (LOMAIRA) 8 MG TABS Take 1 tablet by mouth in the morning. Patient not taking: Reported on 12/25/2022 02/13/22     scopolamine (TRANSDERM-SCOP) 1 MG/3DAYS Apply 1 patch to skin behind the ear as needed every 72 hours Patient not taking: Reported on 12/25/2022 12/16/21     Semaglutide-Weight Management (WEGOVY) 0.25 MG/0.5ML SOAJ Inject 0.25mg  (0.5 mL) Subcutaneously weekly Patient not taking: Reported on 12/25/2022 09/18/21     Semaglutide-Weight Management (WEGOVY) 0.5 MG/0.5ML SOAJ Inject 1 pen (0.5 MG) into the skin once a week  10/14/21     Semaglutide-Weight Management (WEGOVY) 1 MG/0.5ML SOAJ Take 1 mg by mouth once a week. Patient not taking: Reported on 12/25/2022 11/11/21     Semaglutide-Weight Management (WEGOVY) 1 MG/0.5ML SOAJ Inject 1 mg into the skin once a week. Patient not taking: Reported on 12/25/2022 12/16/21     Semaglutide-Weight Management 1 MG/0.5ML SOAJ Inject 1 mg into the skin once a week Patient not taking: Reported on 12/25/2022 11/11/21     tirzepatide (ZEPBOUND) 5 MG/0.5ML Pen Inject 5mg  (0.5 ml) under the skin once weekly. Patient not taking: Reported on 12/25/2022 08/05/22     tirzepatide (ZEPBOUND) 7.5 MG/0.5ML Pen Inject 7.5mg  under the skin once weekly. Patient not taking: Reported on 12/25/2022 08/06/22     tirzepatide (ZEPBOUND) 7.5 MG/0.5ML Pen Inject 7.5 mg into the skin once a week. Patient not taking: Reported on 12/25/2022 08/27/22     tirzepatide (ZEPBOUND) 2.5 MG/0.5ML Pen Inject 2.5 mg into the skin once a week. Patient not taking: Reported on 12/25/2022 02/12/22       Past Medical History:  Diagnosis Date   Allergy    ASCUS (atypical squamous cells of undetermined significance) on Pap smear    Left lower quadrant pain     Past Surgical History:  Procedure Laterality Date   BREAST BIOPSY Left 11/05/2022   MM LT BREAST BX W LOC DEV EA AD LESION IMG BX SPEC STEREO GUIDE 11/05/2022 [facility]   BREAST BIOPSY Left 11/05/2022   MM LT BREAST BX W LOC DEV 1ST LESION IMAGE BX SPEC STEREO GUIDE 11/05/2022 [facility]   NO PAST SURGERIES       reports that she has never smoked. She has never used smokeless tobacco. She reports that she does not drink alcohol and does not use drugs.  Family History  Problem Relation Age of Onset   Melanoma Mother    Arthritis Father    Melanoma Father    Cancer Maternal Grandmother 83       breast - died @ 76   Arthritis Maternal Grandmother    Breast cancer Maternal Grandmother      Physical Exam: Vitals:   12/25/22 2330  12/25/22 2331 12/26/22 0000 12/26/22 0100  BP: 123/82  123/78 117/81  Pulse: (!) 112  99 87  Resp: (!) 25  16 18   Temp:  98 F (36.7 C)    TempSrc:  Oral    SpO2: 94%  95% 98%  Weight:      Height:        Gen: Awake, alert, NAD, well-appearing CV: Tachycardic normal S1, S2, no murmurs  Resp: Normal WOB, CTAB  Abd: Flat, normoactive, nontender MSK: Symmetric, no edema  Skin: No rashes or lesions to exposed skin  Neuro: Alert and interactive  Psych: euthymic, appropriate    Data review:   Labs reviewed, notable for:  High-sensitivity troponin negative x 2, BNP 20 WBC 10, hemoglobin 11  serum hCG negative  Micro:  No results found for this or any previous visit.  Imaging reviewed:  VAS [place] LOWER EXTREMITY VENOUS (DVT) (7a-7p)  Result Date: 12/25/2022  Lower Venous DVT Study Patient Name:  HILARIE HOILAND  Date of Exam:   12/25/2022 Medical Rec #: [id number]      Accession #:    [id number] Date of Birth: 08/27/1965      Patient Gender: F Patient Age:   66 years Exam Location:  [facility] Procedure:      VAS [place] LOWER EXTREMITY VENOUS (DVT) Referring Phys: Arabella Merles --------------------------------------------------------------------------------  Indications: PE in Hx.  Risk Factors: None identified. Comparison Study: No prior study Performing Technologist: Shona Simpson  Examination Guidelines: A complete evaluation includes B-mode imaging, spectral Doppler, color Doppler, and power Doppler as needed of all accessible portions of each vessel. Bilateral testing is considered an integral part of a complete examination. Limited examinations for reoccurring indications may be performed as noted. The reflux portion of the exam is performed with the patient in reverse Trendelenburg.  +---------+---------------+---------+-----------+---------------+-------------+ RIGHT    CompressibilityPhasicitySpontaneityProperties     Thrombus                                                                  Aging         +---------+---------------+---------+-----------+---------------+-------------+ CFV      Partial        Yes      Yes        brightly       Acute                                                     echogenic                    +---------+---------------+---------+-----------+---------------+-------------+ SFJ      Full                                                            +---------+---------------+---------+-----------+---------------+-------------+ FV Prox  Full                                                            +---------+---------------+---------+-----------+---------------+-------------+ FV Mid   Full                                                            +---------+---------------+---------+-----------+---------------+-------------+ FV DistalFull                                                            +---------+---------------+---------+-----------+---------------+-------------+  PFV      None           No       No         brightly       Acute                                                     echogenic                    +---------+---------------+---------+-----------+---------------+-------------+ POP      Full           Yes      Yes                                     +---------+---------------+---------+-----------+---------------+-------------+ PTV      Full                                                            +---------+---------------+---------+-----------+---------------+-------------+ PERO     Full                                                            +---------+---------------+---------+-----------+---------------+-------------+   +---------+---------------+---------+-----------+----------+--------------+ LEFT     CompressibilityPhasicitySpontaneityPropertiesThrombus Aging  +---------+---------------+---------+-----------+----------+--------------+ CFV      Full           Yes      Yes                                 +---------+---------------+---------+-----------+----------+--------------+ SFJ      Full                                                        +---------+---------------+---------+-----------+----------+--------------+ FV Prox  Full                                                        +---------+---------------+---------+-----------+----------+--------------+ FV Mid   Full                                                        +---------+---------------+---------+-----------+----------+--------------+ FV DistalFull                                                        +---------+---------------+---------+-----------+----------+--------------+  PFV      Full                                                        +---------+---------------+---------+-----------+----------+--------------+ POP      Full           Yes      Yes                                 +---------+---------------+---------+-----------+----------+--------------+ PTV      Full                                                        +---------+---------------+---------+-----------+----------+--------------+ PERO     Full                                                        +---------+---------------+---------+-----------+----------+--------------+    Summary: RIGHT: - Findings consistent with acute deep vein thrombosis involving the right common femoral vein, and right proximal profunda vein.  - No cystic structure found in the popliteal fossa.  LEFT: - There is no evidence of deep vein thrombosis in the lower extremity.  - No cystic structure found in the popliteal fossa.  *See table(s) above for measurements and observations.    Preliminary    CT Angio Chest PE W and/or Wo Contrast  Result Date: 12/25/2022 CLINICAL DATA:  Positive  D-dimer and chest pain on the left, initial encounter EXAM: CT ANGIOGRAPHY CHEST WITH CONTRAST TECHNIQUE: Multidetector CT imaging of the chest was performed using the standard protocol during bolus administration of intravenous contrast. Multiplanar CT image reconstructions and MIPs were obtained to evaluate the vascular anatomy. RADIATION DOSE REDUCTION: This exam was performed according to the departmental dose-optimization program which includes automated exposure control, adjustment of the mA and/or kV according to patient size and/or use of iterative reconstruction technique. CONTRAST:  80mL OMNIPAQUE IOHEXOL 350 MG/ML SOLN COMPARISON:  None Available. FINDINGS: Cardiovascular: Thoracic aorta shows no aneurysmal dilatation or dissection. Heart is not significantly enlarged in size. Pulmonary artery is well visualized and demonstrates a normal branching pattern bilaterally. Extensive left-sided central lobar and left lower lobe pulmonary emboli are noted. No evidence of right heart strain is seen. No significant emboli are noted on the right. Mediastinum/Nodes: Thoracic inlet is within normal limits. No hilar or mediastinal adenopathy is noted. The esophagus as visualized is within normal limits. Lungs/Pleura: Mild right basilar atelectasis is seen. A 3 mm noncalcified nodule is noted in the right lower lobe. This is best visualized on image number 115 of series 12. No other nodules are seen within the right lung. The left lung is well aerated with lower lobe airspace opacity likely related to the known pulmonary emboli. Upper Abdomen: Visualized upper abdomen is unremarkable. Musculoskeletal: No chest wall abnormality. No acute or significant osseous findings. Review of the MIP images confirms the above findings. IMPRESSION: Diffuse left lower lobe pulmonary emboli without  evidence of right heart strain. Changes in the left lower lobe likely related to the central pulmonary emboli and possibly representing  early changes of pulmonary infarct. Right solid pulmonary nodule measuring 3 mm. Per Fleischner Society Guidelines, no routine follow-up imaging is recommended. These guidelines do not apply to immunocompromised patients and patients with cancer. Follow up in patients with significant comorbidities as clinically warranted. For lung cancer screening, adhere to Lung-RADS guidelines. Reference: Radiology. 2017; 284(1):228-43. Critical Value/emergent results were called by telephone at the time of interpretation on 12/25/2022 at 6:40 pm to [facility], who verbally acknowledged these results. Electronically Signed   By: Alcide Clever M.D.   On: 12/25/2022 18:41    EKG: ST with no acute ischemic changes    ED Course:  Imaging obtained per above, called for admission     Assessment/Plan:  57 y.o. female with hx hx of rheumatoid arthritis, perimenopausal on Nuvaring, recent plane trip 2 weeks ago, who is admitted with left sided PE with extensive clot burden, and R Common and profunda femoral DVT.   Acute Pulmonary embolism, Left sided with extensive clot burden, early pulmonary infarct, provoked  Acute R common femoral and profunda femoral vein DVT , provoked  Provoked VTE in setting of estrogen containing vaginal ring and recent plane ride. + Fhx of VTE in aunt. Tachycardic which is improving. Otherwise hemodynamically stable. No R heart strain on CT imaging. Trop and BNP both within normal limit. EKG without R heart strain. sPESI 1 for tachycardia.  - Started on heparin gtt, as long as remains stable can transition to DOAC  - TTE to eval for R H strain.  - Tele monitoring - Discontinue use of Nuvaring. Has close OP f/u with gynecology OP.  - IS, flutter valve  - Pain control: tylenol prn mild, oxycodone for mod/severe. Counseled against avoidance of NSAIDS while on anticoagulation  - Duration of anticoagulation to be determined by OP provider, will require at least 3 months   Incidental  findings:  R solid pulm nodule, 3 mm   Chronic medical problems:  RA: On Methotrexate weekly on wed. Continue folate  Perimenopausal: Discontinue nuvaring, OP Gynecology f/u      Body mass index is 29.95 kg/m.    DVT prophylaxis:  IV heparin gtts Code Status:  Full Code Diet:  Diet Orders (From admission, onward)     Start     Ordered   12/25/22 2040  Diet regular Room service appropriate? Yes; Fluid consistency: Thin  Diet effective now       Question Answer Comment  Room service appropriate? Yes   Fluid consistency: Thin      12/25/22 2040           Family Communication:  Yes discussed with husband at bedside   Consults:  No   Admission status:   Inpatient, Step Down Unit  Severity of Illness: The appropriate patient status for this patient is INPATIENT. Inpatient status is judged to be reasonable and necessary in order to provide the required intensity of service to ensure the patient's safety. The patient's presenting symptoms, physical exam findings, and initial radiographic and laboratory data in the context of their chronic comorbidities is felt to place them at high risk for further clinical deterioration. Furthermore, it is not anticipated that the patient will be medically stable for discharge from the hospital within 2 midnights of admission.   * I certify that at the point of admission it is my clinical judgment that  the patient will require inpatient hospital care spanning beyond 2 midnights from the point of admission due to high intensity of service, high risk for further deterioration and high frequency of surveillance required.*   Dolly Rias, MD [facility]  How to contact the [facility] Attending or Consulting provider 7A - 7P or covering provider during after hours 7P -7A, for this patient.  Check the care team in [facility] and look for a) attending/consulting [facility] provider listed and b) the [facility] team listed Log into [email or web address] and use [place]'s  universal password to access. If you do not have the password, please contact the hospital operator. Locate the [facility] provider you are looking for under [facility] and page to a number that you can be directly reached. If you still have difficulty reaching the provider, please page the [facility] (Director on Call) for the Hospitalists listed on amion for assistance.  12/26/2022, 2:38 AM

## 2022-12-26 NOTE — ED Notes (Signed)
Assisted to hospital bed

## 2022-12-27 DIAGNOSIS — I2699 Other pulmonary embolism without acute cor pulmonale: Secondary | ICD-10-CM | POA: Diagnosis not present

## 2022-12-27 DIAGNOSIS — I82411 Acute embolism and thrombosis of right femoral vein: Secondary | ICD-10-CM | POA: Diagnosis not present

## 2022-12-27 LAB — CBC
HCT: 30.2 % — ABNORMAL LOW (ref 36.0–46.0)
Hemoglobin: 10 g/dL — ABNORMAL LOW (ref 12.0–15.0)
MCH: 31.8 pg (ref 26.0–34.0)
MCHC: 33.1 g/dL (ref 30.0–36.0)
MCV: 96.2 fL (ref 80.0–100.0)
Platelets: 310 10*3/uL (ref 150–400)
RBC: 3.14 MIL/uL — ABNORMAL LOW (ref 3.87–5.11)
RDW: 12.5 % (ref 11.5–15.5)
WBC: 10.9 10*3/uL — ABNORMAL HIGH (ref 4.0–10.5)
nRBC: 0 % (ref 0.0–0.2)

## 2022-12-27 LAB — HEPARIN LEVEL (UNFRACTIONATED): Heparin Unfractionated: 0.17 [IU]/mL — ABNORMAL LOW (ref 0.30–0.70)

## 2022-12-27 MED ORDER — APIXABAN 5 MG PO TABS
10.0000 mg | ORAL_TABLET | Freq: Two times a day (BID) | ORAL | Status: DC
Start: 2022-12-27 — End: 2022-12-27
  Administered 2022-12-27: 10 mg via ORAL
  Filled 2022-12-27: qty 2

## 2022-12-27 MED ORDER — POLYETHYLENE GLYCOL 3350 17 G PO PACK: 17.0000 g | PACK | Freq: Every day | ORAL | Status: DC | PRN

## 2022-12-27 MED ORDER — ONDANSETRON 4 MG PO TBDP: 4.0000 mg | ORAL_TABLET | Freq: Three times a day (TID) | ORAL | 0 refills | Status: DC | PRN

## 2022-12-27 MED ORDER — APIXABAN 5 MG PO TABS: ORAL_TABLET | ORAL | 2 refills | Status: DC

## 2022-12-27 MED ORDER — HEPARIN BOLUS VIA INFUSION
2000.0000 [IU] | Freq: Once | INTRAVENOUS | Status: AC
  Administered 2022-12-27: 2000 [IU] via INTRAVENOUS
  Filled 2022-12-27: qty 2000

## 2022-12-27 MED ORDER — OXYCODONE-ACETAMINOPHEN 5-325 MG PO TABS: 1.0000 | ORAL_TABLET | ORAL | 0 refills | Status: AC | PRN

## 2022-12-27 MED ORDER — OXYCODONE-ACETAMINOPHEN 5-325 MG PO TABS
1.0000 | ORAL_TABLET | ORAL | Status: DC | PRN
  Administered 2022-12-27: 1 via ORAL
  Filled 2022-12-27: qty 1

## 2022-12-27 MED ORDER — ONDANSETRON HCL 4 MG PO TABS
8.0000 mg | ORAL_TABLET | Freq: Three times a day (TID) | ORAL | Status: DC | PRN
  Administered 2022-12-27: 8 mg via ORAL
  Filled 2022-12-27: qty 2

## 2022-12-27 MED ORDER — APIXABAN 5 MG PO TABS: 5.0000 mg | ORAL_TABLET | Freq: Two times a day (BID) | ORAL | Status: DC

## 2022-12-27 NOTE — Progress Notes (Signed)
Call to [facility] to confirm they had the Eliquis in doses prescribed - this RN spoke with Melanee Spry, pharmacy tech, who confirmed they had the medication in stock. Pt and s/o  update by this RN.

## 2022-12-27 NOTE — Plan of Care (Signed)
  Problem: Education: Goal: Knowledge of General Education information will improve Description: Including pain rating scale, medication(s)/side effects and non-pharmacologic comfort measures Outcome: Adequate for Discharge   Problem: Health Behavior/Discharge Planning: Goal: Ability to manage health-related needs will improve Outcome: Adequate for Discharge   Problem: Clinical Measurements: Goal: Will remain free from infection Outcome: Adequate for Discharge Goal: Diagnostic test results will improve Outcome: Adequate for Discharge Goal: Cardiovascular complication will be avoided Outcome: Adequate for Discharge   Problem: Nutrition: Goal: Adequate nutrition will be maintained Outcome: Adequate for Discharge

## 2022-12-27 NOTE — Discharge Summary (Addendum)
Physician Discharge Summary  Marissa Marshall:[id number] DOB: 07/28/1965 DOA: 12/25/2022  PCP: Gweneth Dimitri, MD  Admit date: 12/25/2022 Discharge date: 12/27/2022  Admitted From: Home Disposition:  Home  Recommendations for Outpatient Follow-up:  Follow up with PCP in 1-2 weeks She will need a hypercoagulable panel as an outpatient. She also need a CT scan of the chest for nodule greater than 3 mm in 6 to 12 months.   Home Health:No Equipment/Devices:None  Discharge Condition:Stable CODE STATUS:Full Diet recommendation: Heart Healthy   Brief/Interim Summary: 57 y.o. female past medical history of rheumatoid arthritis, perimenopausal on hormonal replacement therapy comes into the ED for acute left-sided chest pain that started 1 day prior to admission D-dimer was elevated CT angio of the chest was done that showed diffuse left lower lobe emboli without evidence of right heart strain.  With possible changes of pulmonary infarct there is a right 3 mm solid nodule needs to be followed up as an outpatient.   Discharge Diagnoses:  Principal Problem:   Pulmonary embolism on left [facility]) Active Problems:   Incidental pulmonary nodule, > 3mm and < 8mm   Rheumatoid arthritis (HCC)  Acute segmental left pulmonary embolism: CT of the chest was done that showed extensive clot burden and early pulmonary infarct. Lower extremity Doppler was positive for right-sided DVT. She was started on IV heparin.  2D echo was done that showed an EF of 60% no right ventricular dysfunction. She was stable on room air not tachycardic and blood pressure stable. She was changed to oral Eliquis which she will continue as an outpatient. She will continue Percocet for pain Zofran for nausea she has been instructed to avoid NSAIDs. MiraLAX for constipation  Incidental pulmonary nodule greater than 3 mm in less than 8 mm. Will need a CT scan in 6 to 12 months an outpatient.  It is  Rheumatoid  arthritis: Continue methotrexate.    Discharge Instructions  Discharge Instructions     Diet - low sodium heart healthy   Complete by: As directed    Increase activity slowly   Complete by: As directed       Allergies as of 12/27/2022       Reactions   Ciprofloxacin Other (See Comments)   Severe joint pain   Tetracyclines & Related Other (See Comments)   Bad headaches   Sulfa Antibiotics Rash, Other (See Comments)   Bad rash        Medication List     STOP taking these medications    scopolamine 1 MG/3DAYS Commonly known as: TRANSDERM-SCOP   Zepbound 2.5 MG/0.5ML Pen Generic drug: tirzepatide   Zepbound 5 MG/0.5ML Pen Generic drug: tirzepatide   Zepbound 7.5 MG/0.5ML Pen Generic drug: tirzepatide       TAKE these medications    ALPRAZolam 0.25 MG tablet Commonly known as: Xanax Take 1 tablet (0.25 mg total) by mouth as needed for anxiety What changed:  how much to take reasons to take this   apixaban 5 MG Tabs tablet Commonly known as: ELIQUIS Take 2 tablets (10 mg total) by mouth 2 (two) times daily for 7 days, THEN 1 tablet (5 mg total) 2 (two) times daily. Start taking on: December 27, 2022   folic acid 400 MCG tablet Commonly known as: FOLVITE Take 400 mcg by mouth daily.   Lomaira 8 MG Tabs Generic drug: Phentermine HCl Take 1 tablet by mouth in the morning.   methocarbamol 500 MG tablet Commonly known as: ROBAXIN Take 500 mg  by mouth every 6 (six) hours as needed for muscle spasms.   methotrexate 2.5 MG tablet Commonly known as: RHEUMATREX Take 15 mg by mouth every Wednesday.   NON FORMULARY Take 1 tablet by mouth See admin instructions. CBD gummies- Chew 1-2 gummies by mouth at bedtime as needed for joint pain or sleep   ondansetron 4 MG disintegrating tablet Commonly known as: ZOFRAN-ODT Take 1 tablet (4 mg total) by mouth every 8 (eight) hours as needed for nausea or vomiting.   oxyCODONE-acetaminophen 5-325 MG  tablet Commonly known as: PERCOCET/ROXICET Take 1 tablet by mouth every 4 (four) hours as needed for up to 5 days for severe pain (pain score 7-10).   predniSONE 10 MG tablet Commonly known as: DELTASONE Take 10 mg by mouth daily as needed (as directed by provider- take with food).   PriLOSEC OTC 20 MG tablet Generic drug: omeprazole Take 20 mg by mouth daily before breakfast.   Saxenda 18 MG/3ML Sopn Generic drug: Liraglutide -Weight Management Inject 3 mg into the skin once a day 30 days   Saxenda 18 MG/3ML Sopn Generic drug: Liraglutide -Weight Management Inject 3mg  (pt to start with 0.6mg  daily) under the skin once daily   Saxenda 18 MG/3ML Sopn Generic drug: Liraglutide -Weight Management Inject 3 mg into the skin daily.   TechLite Pen Needles 32G X 4 MM Misc Generic drug: Insulin Pen Needle Use one pen needle to inject Saxenda under the skin once daily.   UNKNOWN TO PATIENT Place 1 drop into both eyes See admin instructions. Unnamed prescribed eye drops- Instill 1 drop into each eye once a day as directed/as needed   Wegovy 0.5 MG/0.5ML Soaj Generic drug: Semaglutide-Weight Management Inject 1 pen (0.5 MG) into the skin once a week What changed: Another medication with the same name was removed. Continue taking this medication, and follow the directions you see here.         Follow-up Information     Gweneth Dimitri, MD Follow up.   Specialty: Family Medicine Why: 2 to 4 weeks. Contact information: [address] [place] [address] [phone]         Artis Delay, MD Follow up in 3 month(s).   Specialty: Hematology and Oncology Why: Follow-up for PE Contact information: [address] [place] [address] [phone]                Allergies  Allergen Reactions   Ciprofloxacin Other (See Comments)    Severe joint pain   Tetracyclines & Related Other (See Comments)    Bad headaches   Sulfa Antibiotics Rash and  Other (See Comments)    Bad rash    Consultations: None   Procedures/Studies: ECHOCARDIOGRAM COMPLETE  Result Date: 12/26/2022    ECHOCARDIOGRAM REPORT   Patient Name:   Marissa Marshall Date of Exam: 12/26/2022 Medical Rec #:  [id number]     Height:       65.0 in Accession #:    [id number]    Weight:       180.0 lb Date of Birth:  09-Dec-1965     BSA:          1.892 m Patient Age:    57 years      BP:           130/77 mmHg Patient Gender: F             HR:           97 bpm. Exam  Location:  Inpatient Procedure: 2D Echo, Cardiac Doppler and Color Doppler Indications:    pulmonary embolus  History:        Patient has no prior history of Echocardiogram examinations.  Sonographer:    Karma Ganja Referring Phys: [id number] [facility]  Sonographer Comments: Technically difficult study due to poor echo windows. IMPRESSIONS  1. Left ventricular ejection fraction, by estimation, is 60 to 65%. The left ventricle has normal function. The left ventricle has no regional wall motion abnormalities. Left ventricular diastolic parameters were normal.  2. Right ventricular systolic function is normal. The right ventricular size is normal. There is normal pulmonary artery systolic pressure. The estimated right ventricular systolic pressure is 30.7 mmHg.  3. The mitral valve is normal in structure. No evidence of mitral valve regurgitation. No evidence of mitral stenosis.  4. The aortic valve was not well visualized. Aortic valve regurgitation is not visualized. No aortic stenosis is present.  5. The inferior vena cava is normal in size with <50% respiratory variability, suggesting right atrial pressure of 8 mmHg. Comparison(s): No prior Echocardiogram. FINDINGS  Left Ventricle: Left ventricular ejection fraction, by estimation, is 60 to 65%. The left ventricle has normal function. The left ventricle has no regional wall motion abnormalities. The left ventricular internal cavity size was normal in size. There is  no left  ventricular hypertrophy. Left ventricular diastolic parameters were normal. Right Ventricle: The right ventricular size is normal. No increase in right ventricular wall thickness. Right ventricular systolic function is normal. There is normal pulmonary artery systolic pressure. The tricuspid regurgitant velocity is 2.38 m/s, and  with an assumed right atrial pressure of 8 mmHg, the estimated right ventricular systolic pressure is 30.7 mmHg. Left Atrium: Left atrial size was normal in size. Right Atrium: Right atrial size was normal in size. Pericardium: There is no evidence of pericardial effusion. Mitral Valve: The mitral valve is normal in structure. No evidence of mitral valve regurgitation. No evidence of mitral valve stenosis. Tricuspid Valve: The tricuspid valve is grossly normal. Tricuspid valve regurgitation is trivial. No evidence of tricuspid stenosis. Aortic Valve: The aortic valve was not well visualized. Aortic valve regurgitation is not visualized. No aortic stenosis is present. Aortic valve mean gradient measures 4.0 mmHg. Aortic valve peak gradient measures 6.1 mmHg. Aortic valve area, by VTI measures 2.47 cm. Pulmonic Valve: The pulmonic valve was not well visualized. Pulmonic valve regurgitation is not visualized. No evidence of pulmonic stenosis. Aorta: The ascending aorta was not well visualized and the aortic root is normal in size and structure. Venous: The inferior vena cava is normal in size with less than 50% respiratory variability, suggesting right atrial pressure of 8 mmHg. IAS/Shunts: The interatrial septum was not well visualized.  LEFT VENTRICLE PLAX 2D LVIDd:         4.60 cm   Diastology LVIDs:         3.20 cm   LV e' medial:    9.57 cm/s LV PW:         1.00 cm   LV E/e' medial:  7.2 LV IVS:        1.00 cm   LV e' lateral:   12.40 cm/s LVOT diam:     1.90 cm   LV E/e' lateral: 5.6 LV SV:         60 LV SV Index:   32 LVOT Area:     2.84 cm  RIGHT VENTRICLE  IVC RV Basal  diam:  3.00 cm     IVC diam: 1.80 cm RV S prime:     15.90 cm/s TAPSE (M-mode): 2.8 cm LEFT ATRIUM             Index        RIGHT ATRIUM           Index LA diam:        3.40 cm 1.80 cm/m   RA Area:     12.90 cm LA Vol (A2C):   25.3 ml 13.38 ml/m  RA Volume:   28.10 ml  14.86 ml/m LA Vol (A4C):   49.1 ml 25.96 ml/m LA Biplane Vol: 38.3 ml 20.25 ml/m  AORTIC VALVE AV Area (Vmax):    2.79 cm AV Area (Vmean):   2.12 cm AV Area (VTI):     2.47 cm AV Vmax:           123.00 cm/s AV Vmean:          89.900 cm/s AV VTI:            0.243 m AV Peak Grad:      6.1 mmHg AV Mean Grad:      4.0 mmHg LVOT Vmax:         121.00 cm/s LVOT Vmean:        67.200 cm/s LVOT VTI:          0.212 m LVOT/AV VTI ratio: 0.87  AORTA Ao Root diam: 3.00 cm Ao Asc diam:  2.30 cm MITRAL VALVE               TRICUSPID VALVE MV Area (PHT): 3.91 cm    TR Peak grad:   22.7 mmHg MV Decel Time: 194 msec    TR Vmax:        238.00 cm/s MV E velocity: 69.00 cm/s MV A velocity: 91.70 cm/s  SHUNTS MV E/A ratio:  0.75        Systemic VTI:  0.21 m                            Systemic Diam: 1.90 cm Sunit Tolia Electronically signed by Tessa Lerner Signature Date/Time: 12/26/2022/5:40:25 PM    Final    VAS [place] LOWER EXTREMITY VENOUS (DVT) (7a-7p)  Result Date: 12/26/2022  Lower Venous DVT Study Patient Name:  BRESHAUNA GEBERT  Date of Exam:   12/25/2022 Medical Rec #: [id number]      Accession #:    [id number] Date of Birth: 12/12/1965      Patient Gender: F Patient Age:   36 years Exam Location[facility]  [facility] Procedure:      VAS [place] LOWER EXTREMITY VENOUS (DVT) Referring Phys: Arabella Merles --------------------------------------------------------------------------------  Indications: PE in Hx.  Risk Factors: None identified. Comparison Study: No prior study Performing Technologist: Shona Simpson  Examination Guidelines: A complete evaluation includes B-mode imaging, spectral Doppler, color Doppler, and power Doppler as needed of all accessible  portions of each vessel. Bilateral testing is considered an integral part of a complete examination. Limited examinations for reoccurring indications may be performed as noted. The reflux portion of the exam is performed with the patient in reverse Trendelenburg.  +---------+---------------+---------+-----------+---------------+-------------+ RIGHT    CompressibilityPhasicitySpontaneityProperties     Thrombus  Aging         +---------+---------------+---------+-----------+---------------+-------------+ CFV      Partial        Yes      Yes        brightly       Acute                                                     echogenic                    +---------+---------------+---------+-----------+---------------+-------------+ SFJ      Full                                                            +---------+---------------+---------+-----------+---------------+-------------+ FV Prox  Full                                                            +---------+---------------+---------+-----------+---------------+-------------+ FV Mid   Full                                                            +---------+---------------+---------+-----------+---------------+-------------+ FV DistalFull                                                            +---------+---------------+---------+-----------+---------------+-------------+ PFV      None           No       No         brightly       Acute                                                     echogenic                    +---------+---------------+---------+-----------+---------------+-------------+ POP      Full           Yes      Yes                                     +---------+---------------+---------+-----------+---------------+-------------+ PTV      Full                                                             +---------+---------------+---------+-----------+---------------+-------------+  PERO     Full                                                            +---------+---------------+---------+-----------+---------------+-------------+   +---------+---------------+---------+-----------+----------+--------------+ LEFT     CompressibilityPhasicitySpontaneityPropertiesThrombus Aging +---------+---------------+---------+-----------+----------+--------------+ CFV      Full           Yes      Yes                                 +---------+---------------+---------+-----------+----------+--------------+ SFJ      Full                                                        +---------+---------------+---------+-----------+----------+--------------+ FV Prox  Full                                                        +---------+---------------+---------+-----------+----------+--------------+ FV Mid   Full                                                        +---------+---------------+---------+-----------+----------+--------------+ FV DistalFull                                                        +---------+---------------+---------+-----------+----------+--------------+ PFV      Full                                                        +---------+---------------+---------+-----------+----------+--------------+ POP      Full           Yes      Yes                                 +---------+---------------+---------+-----------+----------+--------------+ PTV      Full                                                        +---------+---------------+---------+-----------+----------+--------------+ PERO     Full                                                        +---------+---------------+---------+-----------+----------+--------------+  Summary: RIGHT: - Findings consistent with acute deep vein thrombosis involving the right common femoral  vein, and right proximal profunda vein.  - No cystic structure found in the popliteal fossa.  LEFT: - There is no evidence of deep vein thrombosis in the lower extremity.  - No cystic structure found in the popliteal fossa.  *See table(s) above for measurements and observations. Electronically signed by Gerarda Fraction on 12/26/2022 at 4:58:31 PM.    Final    CT Angio Chest PE W and/or Wo Contrast  Result Date: 12/25/2022 CLINICAL DATA:  Positive D-dimer and chest pain on the left, initial encounter EXAM: CT ANGIOGRAPHY CHEST WITH CONTRAST TECHNIQUE: Multidetector CT imaging of the chest was performed using the standard protocol during bolus administration of intravenous contrast. Multiplanar CT image reconstructions and MIPs were obtained to evaluate the vascular anatomy. RADIATION DOSE REDUCTION: This exam was performed according to the departmental dose-optimization program which includes automated exposure control, adjustment of the mA and/or kV according to patient size and/or use of iterative reconstruction technique. CONTRAST:  80mL OMNIPAQUE IOHEXOL 350 MG/ML SOLN COMPARISON:  None Available. FINDINGS: Cardiovascular: Thoracic aorta shows no aneurysmal dilatation or dissection. Heart is not significantly enlarged in size. Pulmonary artery is well visualized and demonstrates a normal branching pattern bilaterally. Extensive left-sided central lobar and left lower lobe pulmonary emboli are noted. No evidence of right heart strain is seen. No significant emboli are noted on the right. Mediastinum/Nodes: Thoracic inlet is within normal limits. No hilar or mediastinal adenopathy is noted. The esophagus as visualized is within normal limits. Lungs/Pleura: Mild right basilar atelectasis is seen. A 3 mm noncalcified nodule is noted in the right lower lobe. This is best visualized on image number 115 of series 12. No other nodules are seen within the right lung. The left lung is well aerated with lower lobe  airspace opacity likely related to the known pulmonary emboli. Upper Abdomen: Visualized upper abdomen is unremarkable. Musculoskeletal: No chest wall abnormality. No acute or significant osseous findings. Review of the MIP images confirms the above findings. IMPRESSION: Diffuse left lower lobe pulmonary emboli without evidence of right heart strain. Changes in the left lower lobe likely related to the central pulmonary emboli and possibly representing early changes of pulmonary infarct. Right solid pulmonary nodule measuring 3 mm. Per Fleischner Society Guidelines, no routine follow-up imaging is recommended. These guidelines do not apply to immunocompromised patients and patients with cancer. Follow up in patients with significant comorbidities as clinically warranted. For lung cancer screening, adhere to Lung-RADS guidelines. Reference: Radiology. 2017; 284(1):228-43. Critical Value/emergent results were called by telephone at the time of interpretation on 12/25/2022 at 6:40 pm to [facility], who verbally acknowledged these results. Electronically Signed   By: Alcide Clever M.D.   On: 12/25/2022 18:41   (Echo, Carotid, EGD, Colonoscopy, ERCP)    Subjective: Feels great no new complaints.  Discharge Exam: Vitals:   12/26/22 1929 12/27/22 0431  BP: 107/66 110/73  Pulse: 92 (!) 105  Resp: 16 20  Temp: 98.7 F (37.1 C) 98 F (36.7 C)  SpO2: 96% 96%   Vitals:   12/26/22 1441 12/26/22 1703 12/26/22 1929 12/27/22 0431  BP:   107/66 110/73  Pulse:   92 (!) 105  Resp: 20 19 16 20   Temp:   98.7 F (37.1 C) 98 F (36.7 C)  TempSrc:   Oral Oral  SpO2:   96% 96%  Weight:      Height:  General: Pt is alert, awake, not in acute distress Cardiovascular: RRR, S1/S2 +, no rubs, no gallops Respiratory: CTA bilaterally, no wheezing, no rhonchi Abdominal: Soft, NT, ND, bowel sounds + Extremities: no edema, no cyanosis    The results of significant diagnostics from this  hospitalization (including imaging, microbiology, ancillary and laboratory) are listed below for reference.     Microbiology: No results found for this or any previous visit (from the past 240 hour(s)).   Labs: BNP (last 3 results) Recent Labs    12/25/22 1645  BNP 20.9   Basic Metabolic Panel: Recent Labs  Lab 12/25/22 1545 12/25/22 1652 12/26/22 0304  NA 135  --  136  K 3.5  --  3.5  CL 105  --  107  CO2 22  --  18*  GLUCOSE 111*  --  102*  BUN 11  --  7  CREATININE 0.86 0.80 0.74  CALCIUM 8.8*  --  8.8*  MG  --   --  2.3  PHOS  --   --  3.0   Liver Function Tests: Recent Labs  Lab 12/25/22 1545  AST 17  ALT 11  ALKPHOS 52  BILITOT 0.7  PROT 7.1  ALBUMIN 3.4*   No results for input(s): "LIPASE", "AMYLASE" in the last 168 hours. No results for input(s): "AMMONIA" in the last 168 hours. CBC: Recent Labs  Lab 12/25/22 1545 12/26/22 0304 12/27/22 0434  WBC 10.9* 12.5* 10.9*  NEUTROABS 9.8*  --   --   HGB 11.7* 11.8* 10.0*  HCT 33.9* 34.3* 30.2*  MCV 93.9 94.0 96.2  PLT 317 357 310   Cardiac Enzymes: No results for input(s): "CKTOTAL", "CKMB", "CKMBINDEX", "TROPONINI" in the last 168 hours. BNP: Invalid input(s): "POCBNP" CBG: No results for input(s): "GLUCAP" in the last 168 hours. D-Dimer No results for input(s): "DDIMER" in the last 72 hours. Hgb A1c No results for input(s): "HGBA1C" in the last 72 hours. Lipid Profile No results for input(s): "CHOL", "HDL", "LDLCALC", "TRIG", "CHOLHDL", "LDLDIRECT" in the last 72 hours. Thyroid function studies No results for input(s): "TSH", "T4TOTAL", "T3FREE", "THYROIDAB" in the last 72 hours.  Invalid input(s): "FREET3" Anemia work up No results for input(s): "VITAMINB12", "FOLATE", "FERRITIN", "TIBC", "IRON", "RETICCTPCT" in the last 72 hours. Urinalysis No results found for: "COLORURINE", "APPEARANCEUR", "LABSPEC", "PHURINE", "GLUCOSEU", "HGBUR", "BILIRUBINUR", "KETONESUR", "PROTEINUR", "UROBILINOGEN",  "NITRITE", "LEUKOCYTESUR" Sepsis Labs Recent Labs  Lab 12/25/22 1545 12/26/22 0304 12/27/22 0434  WBC 10.9* 12.5* 10.9*   Microbiology No results found for this or any previous visit (from the past 240 hour(s)).   Time coordinating discharge: Over 35 minutes  SIGNED:   Marinda Elk, MD  [facility] 12/27/2022, 8:38 AM Pager   If 7PM-7AM, please contact night-coverage [email or web address] Password [email or web address]

## 2022-12-27 NOTE — Progress Notes (Signed)
AVS reviewed w/ pt who verbalized an understanding- no other questions at this time. PIV removed as documented. Pt dressing for d/c to home. Husband has returned to bedside. Pt to main lobby via w/c - home w/ spouse

## 2022-12-27 NOTE — Discharge Instructions (Addendum)
Information on my medicine - ELIQUIS (apixaban)  This medication education was reviewed with me or my healthcare representative as part of my discharge preparation.  The pharmacist that spoke with me during my hospital stay was:  Cindi Carbon, [facility]  Why was Eliquis prescribed for you? Eliquis was prescribed to treat blood clots that may have been found in the veins of your legs (deep vein thrombosis) or in your lungs (pulmonary embolism) and to reduce the risk of them occurring again.  What do You need to know about Eliquis ? The starting dose is 10 mg (two 5 mg tablets) taken TWICE daily for the FIRST SEVEN (7) DAYS, then on (enter date)  01/03/23  the dose is reduced to ONE 5 mg tablet taken TWICE daily.  Eliquis may be taken with or without food.   Try to take the dose about the same time in the morning and in the evening. If you have difficulty swallowing the tablet whole please discuss with your pharmacist how to take the medication safely.  Take Eliquis exactly as prescribed and DO NOT stop taking Eliquis without talking to the doctor who prescribed the medication.  Stopping may increase your risk of developing a new blood clot.  Refill your prescription before you run out.  After discharge, you should have regular check-up appointments with your healthcare provider that is prescribing your Eliquis.    What do you do if you miss a dose? If a dose of ELIQUIS is not taken at the scheduled time, take it as soon as possible on the same day and twice-daily administration should be resumed. The dose should not be doubled to make up for a missed dose.  Important Safety Information A possible side effect of Eliquis is bleeding. You should call your healthcare provider right away if you experience any of the following: Bleeding from an injury or your nose that does not stop. Unusual colored urine (red or dark brown) or unusual colored stools (red or black). Unusual bruising for unknown  reasons. A serious fall or if you hit your head (even if there is no bleeding).  Some medicines may interact with Eliquis and might increase your risk of bleeding or clotting while on Eliquis. To help avoid this, consult your healthcare provider or pharmacist prior to using any new prescription or non-prescription medications, including herbals, vitamins, non-steroidal anti-inflammatory drugs (NSAIDs) and supplements.  This website has more information on Eliquis (apixaban): http://www.eliquis.com/eliquis/home

## 2022-12-27 NOTE — Progress Notes (Signed)
PHARMACY - ANTICOAGULATION CONSULT NOTE  Pharmacy Consult for heparin  Indication: acute pulmonary embolus and DVT  Allergies  Allergen Reactions   Ciprofloxacin Other (See Comments)    Severe joint pain   Tetracyclines & Related Other (See Comments)    Bad headaches   Sulfa Antibiotics Rash and Other (See Comments)    Bad rash    Patient Measurements: Height: 5\' 5"  (165.1 cm) Weight: 81.6 kg (180 lb) IBW/kg (Calculated) : 57 Heparin Dosing Weight: 74 kg  Vital Signs: Temp: 98 F (36.7 C) (10/26 0431) Temp Source: Oral (10/26 0431) BP: 110/73 (10/26 0431) Pulse Rate: 105 (10/26 0431)  Labs: Recent Labs    12/25/22 1545 12/25/22 1645 12/25/22 1652 12/26/22 0304 12/26/22 0854 12/27/22 0434  HGB 11.7*  --   --  11.8*  --  10.0*  HCT 33.9*  --   --  34.3*  --  30.2*  PLT 317  --   --  357  --  310  HEPARINUNFRC  --   --   --  0.42 0.42 0.17*  CREATININE 0.86  --  0.80 0.74  --   --   TROPONINIHS <2 <2  --   --   --   --     Estimated Creatinine Clearance: 83.8 mL/min (by C-G formula based on SCr of 0.74 mg/dL).   Medical History: Past Medical History:  Diagnosis Date   Allergy    ASCUS (atypical squamous cells of undetermined significance) on Pap smear    Left lower quadrant pain     Assessment: Patient is a 57 y.o F who presented to Urgent Care on 12/25/22 with c/o left rib pain and was found to have elevated D-dimer. She was then sent to the ED for further workup.  Chest CT done in the ED on 12/25/22 showed acute LLL PE without evidence of RHS and findings with concern for possible pulmonary infarct.  LE doppler also resulted back with acute RLE DVT.  She's currently on heparin drip for VTE treatment.  Today, 12/27/2022: - heparin level = 0.17 (subtherapeutic) with heparin gtt @ 1200 units/hr - Hgb 10, pltc 310k - no interruption in heparin therapy and no issues per RN - no bleeding reported  Goal of Therapy:  Heparin level 0.3-0.7 units/ml Monitor  platelets by anticoagulation protocol: Yes   Plan:  - Give heparin 2000 unit IV bolus x 1 - Increase heparin drip to 1400 units/hr - Check heparin level 6 hr after rate increase - daily heparin level - monitor for s/sx bleeding   Newton Frutiger, Joselyn Glassman, PharmD 12/27/2022,6:19 AM

## 2022-12-31 DIAGNOSIS — R911 Solitary pulmonary nodule: Secondary | ICD-10-CM | POA: Diagnosis not present

## 2022-12-31 DIAGNOSIS — I82401 Acute embolism and thrombosis of unspecified deep veins of right lower extremity: Secondary | ICD-10-CM | POA: Diagnosis not present

## 2022-12-31 DIAGNOSIS — I2699 Other pulmonary embolism without acute cor pulmonale: Secondary | ICD-10-CM | POA: Diagnosis not present

## 2023-01-05 DIAGNOSIS — Z780 Asymptomatic menopausal state: Secondary | ICD-10-CM | POA: Diagnosis not present

## 2023-01-05 DIAGNOSIS — R632 Polyphagia: Secondary | ICD-10-CM | POA: Diagnosis not present

## 2023-01-05 DIAGNOSIS — Z9189 Other specified personal risk factors, not elsewhere classified: Secondary | ICD-10-CM | POA: Diagnosis not present

## 2023-01-05 DIAGNOSIS — I2699 Other pulmonary embolism without acute cor pulmonale: Secondary | ICD-10-CM | POA: Diagnosis not present

## 2023-01-09 DIAGNOSIS — H903 Sensorineural hearing loss, bilateral: Secondary | ICD-10-CM | POA: Diagnosis not present

## 2023-01-09 DIAGNOSIS — H9311 Tinnitus, right ear: Secondary | ICD-10-CM | POA: Diagnosis not present

## 2023-01-12 DIAGNOSIS — M722 Plantar fascial fibromatosis: Secondary | ICD-10-CM | POA: Diagnosis not present

## 2023-01-12 DIAGNOSIS — H9311 Tinnitus, right ear: Secondary | ICD-10-CM | POA: Diagnosis not present

## 2023-01-12 DIAGNOSIS — M0579 Rheumatoid arthritis with rheumatoid factor of multiple sites without organ or systems involvement: Secondary | ICD-10-CM | POA: Diagnosis not present

## 2023-01-12 DIAGNOSIS — I2699 Other pulmonary embolism without acute cor pulmonale: Secondary | ICD-10-CM | POA: Diagnosis not present

## 2023-01-13 DIAGNOSIS — Z01419 Encounter for gynecological examination (general) (routine) without abnormal findings: Secondary | ICD-10-CM | POA: Diagnosis not present

## 2023-01-13 DIAGNOSIS — I2699 Other pulmonary embolism without acute cor pulmonale: Secondary | ICD-10-CM | POA: Diagnosis not present

## 2023-01-13 DIAGNOSIS — Z1330 Encounter for screening examination for mental health and behavioral disorders, unspecified: Secondary | ICD-10-CM | POA: Diagnosis not present

## 2023-01-20 DIAGNOSIS — N951 Menopausal and female climacteric states: Secondary | ICD-10-CM | POA: Diagnosis not present

## 2023-01-22 ENCOUNTER — Other Ambulatory Visit: Payer: Self-pay

## 2023-01-22 ENCOUNTER — Other Ambulatory Visit (HOSPITAL_COMMUNITY): Payer: Self-pay

## 2023-01-22 MED ORDER — ZEPBOUND 7.5 MG/0.5ML ~~LOC~~ SOAJ
7.5000 mg | SUBCUTANEOUS | 2 refills | Status: DC
  Filled 2023-01-22: qty 2, 28d supply, fill #0
  Filled 2023-02-17: qty 2, 28d supply, fill #1
  Filled 2023-03-17: qty 2, 28d supply, fill #2

## 2023-01-23 ENCOUNTER — Other Ambulatory Visit (HOSPITAL_COMMUNITY): Payer: Self-pay

## 2023-01-23 ENCOUNTER — Telehealth: Payer: Self-pay

## 2023-01-23 MED ORDER — ZEPBOUND 7.5 MG/0.5ML ~~LOC~~ SOAJ
7.5000 mg | SUBCUTANEOUS | 2 refills | Status: DC
  Filled 2023-01-23: qty 2, 30d supply, fill #0
  Filled 2023-06-18: qty 2, 28d supply, fill #0

## 2023-01-23 NOTE — Telephone Encounter (Signed)
Returned her call. Per Dr. Bertis Ruddy, since her PE was more than 1 month ago, Dr. Bertis Ruddy is ok with her flying and okay with continuing Zepbound injection. If she needs to be seen sooner that Jan for medication refill. Dr. Bertis Ruddy will try to work her in, otherwise keep her appt as scheduled. She verbalized understanding PCP is refilling Eliquis. She will call the office back if earlier appt needed.

## 2023-02-02 DIAGNOSIS — R632 Polyphagia: Secondary | ICD-10-CM | POA: Diagnosis not present

## 2023-02-02 DIAGNOSIS — E663 Overweight: Secondary | ICD-10-CM | POA: Diagnosis not present

## 2023-02-02 DIAGNOSIS — I2699 Other pulmonary embolism without acute cor pulmonale: Secondary | ICD-10-CM | POA: Diagnosis not present

## 2023-02-20 ENCOUNTER — Other Ambulatory Visit (HOSPITAL_COMMUNITY): Payer: Self-pay

## 2023-03-11 DIAGNOSIS — Z8639 Personal history of other endocrine, nutritional and metabolic disease: Secondary | ICD-10-CM | POA: Diagnosis not present

## 2023-03-11 DIAGNOSIS — R632 Polyphagia: Secondary | ICD-10-CM | POA: Diagnosis not present

## 2023-03-11 DIAGNOSIS — E663 Overweight: Secondary | ICD-10-CM | POA: Diagnosis not present

## 2023-03-11 DIAGNOSIS — K5903 Drug induced constipation: Secondary | ICD-10-CM | POA: Diagnosis not present

## 2023-03-18 DIAGNOSIS — Z23 Encounter for immunization: Secondary | ICD-10-CM | POA: Diagnosis not present

## 2023-03-18 DIAGNOSIS — Z13220 Encounter for screening for lipoid disorders: Secondary | ICD-10-CM | POA: Diagnosis not present

## 2023-03-18 DIAGNOSIS — I2699 Other pulmonary embolism without acute cor pulmonale: Secondary | ICD-10-CM | POA: Diagnosis not present

## 2023-03-18 DIAGNOSIS — N951 Menopausal and female climacteric states: Secondary | ICD-10-CM | POA: Diagnosis not present

## 2023-03-18 DIAGNOSIS — Z0000 Encounter for general adult medical examination without abnormal findings: Secondary | ICD-10-CM | POA: Diagnosis not present

## 2023-03-18 DIAGNOSIS — Z1389 Encounter for screening for other disorder: Secondary | ICD-10-CM | POA: Diagnosis not present

## 2023-03-18 DIAGNOSIS — K219 Gastro-esophageal reflux disease without esophagitis: Secondary | ICD-10-CM | POA: Diagnosis not present

## 2023-03-19 ENCOUNTER — Other Ambulatory Visit (HOSPITAL_COMMUNITY): Payer: Self-pay

## 2023-03-19 MED ORDER — ELIQUIS 5 MG PO TABS
5.0000 mg | ORAL_TABLET | Freq: Two times a day (BID) | ORAL | 1 refills | Status: DC
  Filled 2023-03-19: qty 180, 90d supply, fill #0
  Filled 2023-06-15: qty 60, 30d supply, fill #1

## 2023-03-22 DIAGNOSIS — J358 Other chronic diseases of tonsils and adenoids: Secondary | ICD-10-CM | POA: Diagnosis not present

## 2023-03-22 DIAGNOSIS — R0982 Postnasal drip: Secondary | ICD-10-CM | POA: Diagnosis not present

## 2023-03-24 ENCOUNTER — Inpatient Hospital Stay: Payer: BC Managed Care – PPO | Attending: Hematology and Oncology | Admitting: Hematology and Oncology

## 2023-03-24 ENCOUNTER — Encounter: Payer: Self-pay | Admitting: Hematology and Oncology

## 2023-03-24 VITALS — BP 118/89 | HR 94 | Temp 98.1°F | Resp 18 | Ht 65.0 in | Wt 174.0 lb

## 2023-03-24 DIAGNOSIS — I2699 Other pulmonary embolism without acute cor pulmonale: Secondary | ICD-10-CM | POA: Diagnosis not present

## 2023-03-24 DIAGNOSIS — R911 Solitary pulmonary nodule: Secondary | ICD-10-CM | POA: Diagnosis not present

## 2023-03-24 DIAGNOSIS — M069 Rheumatoid arthritis, unspecified: Secondary | ICD-10-CM | POA: Insufficient documentation

## 2023-03-24 DIAGNOSIS — M059 Rheumatoid arthritis with rheumatoid factor, unspecified: Secondary | ICD-10-CM

## 2023-03-24 DIAGNOSIS — Z7901 Long term (current) use of anticoagulants: Secondary | ICD-10-CM | POA: Insufficient documentation

## 2023-03-24 DIAGNOSIS — Z803 Family history of malignant neoplasm of breast: Secondary | ICD-10-CM | POA: Diagnosis not present

## 2023-03-25 ENCOUNTER — Encounter: Payer: Self-pay | Admitting: Hematology and Oncology

## 2023-03-25 NOTE — Assessment & Plan Note (Signed)
Her autoimmune disorder will cause chronic inflammation and that has been associated with risk of thrombosis but overall, her rheumatoid arthritis is well-controlled with methotrexate

## 2023-03-25 NOTE — Assessment & Plan Note (Addendum)
I have to identify several risk factors as causes of her recent provoked pulmonary embolism Since her diagnosis, her nuva ring has been removed She will start to drink more liquids to avoid dehydration We also discussed avoidance of artificial sweetener that has been associated with risk of clotting We also discussed importance of frequent ambulation when she started traveling again She does not need genetic testing to look for thrombophilia disorder She will continue current anticoagulation therapy for the next 3 months and when I see her back in April, we will discuss risk and benefits of discontinuation of anticoagulation therapy

## 2023-03-25 NOTE — Progress Notes (Signed)
[facility] CONSULT NOTE  Patient Care Team: Gweneth Dimitri, MD as PCP - General [facility] Medicine)   ASSESSMENT & PLAN:  Pulmonary embolism on left [facility]) I have to identify several risk factors as causes of her recent provoked pulmonary embolism Since her diagnosis, her nuva ring has been removed She will start to drink more liquids to avoid dehydration We also discussed avoidance of artificial sweetener that has been associated with risk of clotting We also discussed importance of frequent ambulation when she started traveling again She does not need genetic testing to look for thrombophilia disorder She will continue current anticoagulation therapy for the next 3 months and when I see her back in April, we will discuss risk and benefits of discontinuation of anticoagulation therapy   Rheumatoid arthritis (HCC) Her autoimmune disorder will cause chronic inflammation and that has been associated with risk of thrombosis but overall, her rheumatoid arthritis is well-controlled with methotrexate  Incidental pulmonary nodule, > 3mm and < 8mm The patient is not a smoker and does not have diagnosis of cancer According to the latest guidelines, she does not need long-term monitoring of her lung nodule  Orders Placed This Encounter  Procedures   CMP (Cancer Center only)    Standing Status:   Future    Expiration Date:   03/24/2024   CBC with Differential (Cancer Center Only)    Standing Status:   Future    Expiration Date:   03/24/2024    All questions were answered. The patient knows to call the clinic with any problems, questions or concerns. The total time spent in the appointment was 60 minutes encounter with patients including review of chart and various tests results, discussions about plan of care and coordination of care plan  Artis Delay, MD 1/22/202510:25 AM  CHIEF COMPLAINTS/PURPOSE OF CONSULTATION:  Provoked PE, for further management  HISTORY OF PRESENTING  ILLNESS:  Marissa [facility] 58 y.o. female is here because of recent diagnosis of PE The patient had background history of rheumatoid arthritis and has been on methotrexate for a while In September, she underwent breast biopsy, result was benign Otherwise, she is up-to-date with her age-appropriate screening The patient travels quite a bit prior to her diagnosis of PE In August, she flew to [place].  In September, she flew to [place] and also to [place]. She presented to the urgent care after presentation of chest/rib pain  She was then directed to the ER On December 25, 2022, CT pulmonary angiogram showed Diffuse left lower lobe pulmonary emboli without evidence of right heart strain.   Changes in the left lower lobe likely related to the central pulmonary emboli and possibly representing early changes of pulmonary infarct.   Right solid pulmonary nodule measuring 3 mm. Per Fleischner Society Guidelines, no routine follow-up imaging is recommended. She was placed on anticoagulation therapy.  Her chest pain resolved within the month.  Currently, she denies shortness of breath.   Prior to the diagnosis, she denies lower extremity swelling, warmth, tenderness & erythema.   She does not smoke.  She is placed on NuvaRing for a long time initially for contraceptive purposes Since the diagnosis, the NuvaRing was removed  She had no prior history or diagnosis of cancer. Her age appropriate screening programs are up-to-date. She had prior surgeries before and never had perioperative thromboembolic events.  The patient had been pregnant before and denies history of peripartum thromboembolic event or history of recurrent miscarriages. There is no family history of blood  clots or miscarriages, except for stroke and a paternal aunt  MEDICAL HISTORY:  Past Medical History:  Diagnosis Date   Allergy    ASCUS (atypical squamous cells of undetermined significance) on Pap smear    Incidental pulmonary nodule,  > 3mm and < 8mm 12/26/2022   Left lower quadrant pain    Pulmonary embolism on left (HCC) 12/25/2022   Rheumatoid arthritis (HCC) 12/26/2022    SURGICAL HISTORY: Past Surgical History:  Procedure Laterality Date   BREAST BIOPSY Left 11/05/2022   MM LT BREAST BX W LOC DEV EA AD LESION IMG BX SPEC STEREO GUIDE 11/05/2022 [facility]   BREAST BIOPSY Left 11/05/2022   MM LT BREAST BX W LOC DEV 1ST LESION IMAGE BX SPEC STEREO GUIDE 11/05/2022 [facility]   NO PAST SURGERIES      SOCIAL HISTORY: Social History   Socioeconomic History   Marital status: Married    Spouse name: Not on file   Number of children: 2   Years of education: Not on file   Highest education level: Not on file  Occupational History   Not on file  Tobacco Use   Smoking status: Never   Smokeless tobacco: Never  Substance and Sexual Activity   Alcohol use: No   Drug use: No   Sexual activity: Yes    Birth control/protection: Inserts    Comment: Nuvaring  Other Topics Concern   Not on file  Social History Narrative   Not on file   Social Drivers of Health   Financial Resource Strain: Not on file  Food Insecurity: Low Risk  (01/09/2023)   Received from [facility]   Hunger Vital Sign    Worried About Running Out of Food in the Last Year: Never true    Ran Out of Food in the Last Year: Never true  Transportation Needs: No Transportation Needs (01/09/2023)   Received from [facility]    In the past 12 months, has lack of reliable transportation kept you from medical appointments, meetings, work or from getting things needed for daily living? : No  Physical Activity: Not on file  Stress: Not on file  Social Connections: Unknown (07/16/2021)   Received from [facility], [facility]   Social Network    Social Network: Not on file  Intimate Partner Violence: Not At Risk (12/26/2022)   Humiliation, Afraid, Rape, and Kick questionnaire    Fear of Current or Ex-Partner: No     Emotionally Abused: No    Physically Abused: No    Sexually Abused: No    FAMILY HISTORY: Family History  Problem Relation Age of Onset   Melanoma Mother    Arthritis Father    Melanoma Father    Cancer Maternal Grandmother 35       breast - died @ 58   Arthritis Maternal Grandmother    Breast cancer Maternal Grandmother     ALLERGIES:  is allergic to ciprofloxacin, tetracyclines & related, and sulfa antibiotics.  MEDICATIONS:  Current Outpatient Medications  Medication Sig Dispense Refill   ALPRAZolam (XANAX) 0.25 MG tablet Take 1 tablet (0.25 mg total) by mouth as needed for anxiety (Patient taking differently: Take 0.125-0.25 mg by mouth daily as needed for anxiety or sleep.) 10 tablet 0   apixaban (ELIQUIS) 5 MG TABS tablet Take 1 tablet (5 mg total) by mouth 2 (two) times daily. 180 tablet 1   folic acid (FOLVITE) 400 MCG tablet Take 400 mcg by mouth daily.  methotrexate (RHEUMATREX) 2.5 MG tablet Take 15 mg by mouth every Wednesday.     PRILOSEC OTC 20 MG tablet Take 20 mg by mouth daily before breakfast.     tirzepatide (ZEPBOUND) 7.5 MG/0.5ML Pen Inject 7.5 mg into the skin once a week. 2 mL 2   No current facility-administered medications for this visit.    REVIEW OF SYSTEMS:   Constitutional: Denies fevers, chills or abnormal night sweats Eyes: Denies blurriness of vision, double vision or watery eyes Ears, nose, mouth, throat, and face: Denies mucositis or sore throat Respiratory: Denies cough, dyspnea or wheezes Cardiovascular: Denies palpitation, chest discomfort or lower extremity swelling Gastrointestinal:  Denies nausea, heartburn or change in bowel habits Skin: Denies abnormal skin rashes Lymphatics: Denies new lymphadenopathy or easy bruising Neurological:Denies numbness, tingling or new weaknesses Behavioral/Psych: Mood is stable, no new changes  All other systems were reviewed with the patient and are negative.  PHYSICAL EXAMINATION: ECOG  PERFORMANCE STATUS: 0 - Asymptomatic  Vitals:   03/24/23 1354  BP: 118/89  Pulse: 94  Resp: 18  Temp: 98.1 F (36.7 C)  SpO2: 100%   Filed Weights   03/24/23 1354  Weight: 174 lb (78.9 kg)    GENERAL:alert, no distress and comfortable SKIN: skin color, texture, turgor are normal, no rashes or significant lesions EYES: normal, conjunctiva are pink and non-injected, sclera clear OROPHARYNX:no exudate, no erythema and lips, buccal mucosa, and tongue normal  NECK: supple, thyroid normal size, non-tender, without nodularity LYMPH:  no palpable lymphadenopathy in the cervical, axillary or inguinal LUNGS: clear to auscultation and percussion with normal breathing effort HEART: regular rate & rhythm and no murmurs and no lower extremity edema ABDOMEN:abdomen soft, non-tender and normal bowel sounds Musculoskeletal:no cyanosis of digits and no clubbing  PSYCH: alert & oriented x 3 with fluent speech NEURO: no focal motor/sensory deficits  LABORATORY DATA:  I have reviewed the data as listed Lab Results  Component Value Date   WBC 10.9 (H) 12/27/2022   HGB 10.0 (L) 12/27/2022   HCT 30.2 (L) 12/27/2022   MCV 96.2 12/27/2022   PLT 310 12/27/2022

## 2023-03-25 NOTE — Assessment & Plan Note (Signed)
The patient is not a smoker and does not have diagnosis of cancer According to the latest guidelines, she does not need long-term monitoring of her lung nodule

## 2023-03-26 ENCOUNTER — Ambulatory Visit: Payer: BC Managed Care – PPO | Admitting: Hematology and Oncology

## 2023-03-26 ENCOUNTER — Other Ambulatory Visit: Payer: BC Managed Care – PPO

## 2023-04-10 ENCOUNTER — Other Ambulatory Visit (HOSPITAL_COMMUNITY): Payer: Self-pay

## 2023-04-10 ENCOUNTER — Encounter (HOSPITAL_COMMUNITY): Payer: Self-pay

## 2023-04-10 DIAGNOSIS — E7800 Pure hypercholesterolemia, unspecified: Secondary | ICD-10-CM | POA: Diagnosis not present

## 2023-04-10 DIAGNOSIS — E663 Overweight: Secondary | ICD-10-CM | POA: Diagnosis not present

## 2023-04-10 DIAGNOSIS — K219 Gastro-esophageal reflux disease without esophagitis: Secondary | ICD-10-CM | POA: Diagnosis not present

## 2023-04-10 DIAGNOSIS — R632 Polyphagia: Secondary | ICD-10-CM | POA: Diagnosis not present

## 2023-04-10 MED ORDER — ZEPBOUND 5 MG/0.5ML ~~LOC~~ SOAJ
5.0000 mg | SUBCUTANEOUS | 1 refills | Status: DC
  Filled 2023-04-10: qty 2, 28d supply, fill #0

## 2023-04-13 ENCOUNTER — Other Ambulatory Visit: Payer: Self-pay

## 2023-04-13 ENCOUNTER — Other Ambulatory Visit (HOSPITAL_BASED_OUTPATIENT_CLINIC_OR_DEPARTMENT_OTHER): Payer: Self-pay

## 2023-04-13 ENCOUNTER — Other Ambulatory Visit (HOSPITAL_COMMUNITY): Payer: Self-pay

## 2023-04-13 MED ORDER — ZEPBOUND 5 MG/0.5ML ~~LOC~~ SOAJ
5.0000 mg | SUBCUTANEOUS | 1 refills | Status: DC
  Filled 2023-04-13 – 2023-04-14 (×3): qty 2, 28d supply, fill #0

## 2023-04-14 ENCOUNTER — Other Ambulatory Visit (HOSPITAL_COMMUNITY): Payer: Self-pay

## 2023-04-22 ENCOUNTER — Other Ambulatory Visit: Payer: Self-pay | Admitting: Medical Genetics

## 2023-05-06 ENCOUNTER — Other Ambulatory Visit (HOSPITAL_COMMUNITY): Payer: Self-pay

## 2023-05-08 ENCOUNTER — Other Ambulatory Visit (HOSPITAL_COMMUNITY): Payer: Self-pay

## 2023-05-08 DIAGNOSIS — K219 Gastro-esophageal reflux disease without esophagitis: Secondary | ICD-10-CM | POA: Diagnosis not present

## 2023-05-08 DIAGNOSIS — F418 Other specified anxiety disorders: Secondary | ICD-10-CM | POA: Diagnosis not present

## 2023-05-08 DIAGNOSIS — R1012 Left upper quadrant pain: Secondary | ICD-10-CM | POA: Diagnosis not present

## 2023-05-08 DIAGNOSIS — R632 Polyphagia: Secondary | ICD-10-CM | POA: Diagnosis not present

## 2023-05-08 MED ORDER — ZEPBOUND 7.5 MG/0.5ML ~~LOC~~ SOAJ
7.5000 mg | SUBCUTANEOUS | 1 refills | Status: DC
  Filled 2023-05-08: qty 2, 28d supply, fill #0
  Filled 2023-06-01: qty 2, 28d supply, fill #1

## 2023-05-08 MED ORDER — ALPRAZOLAM 0.25 MG PO TABS
0.2500 mg | ORAL_TABLET | Freq: Every day | ORAL | 0 refills | Status: DC | PRN
  Filled 2023-05-08: qty 15, 15d supply, fill #0

## 2023-05-09 ENCOUNTER — Other Ambulatory Visit (HOSPITAL_COMMUNITY): Payer: Self-pay

## 2023-05-13 ENCOUNTER — Other Ambulatory Visit (HOSPITAL_COMMUNITY): Payer: Self-pay | Attending: Medical Genetics

## 2023-06-01 ENCOUNTER — Other Ambulatory Visit (HOSPITAL_COMMUNITY): Payer: Self-pay

## 2023-06-12 DIAGNOSIS — E88819 Insulin resistance, unspecified: Secondary | ICD-10-CM | POA: Diagnosis not present

## 2023-06-12 DIAGNOSIS — F418 Other specified anxiety disorders: Secondary | ICD-10-CM | POA: Diagnosis not present

## 2023-06-12 DIAGNOSIS — R632 Polyphagia: Secondary | ICD-10-CM | POA: Diagnosis not present

## 2023-06-12 DIAGNOSIS — K219 Gastro-esophageal reflux disease without esophagitis: Secondary | ICD-10-CM | POA: Diagnosis not present

## 2023-06-15 ENCOUNTER — Other Ambulatory Visit (HOSPITAL_COMMUNITY): Payer: Self-pay

## 2023-06-18 ENCOUNTER — Other Ambulatory Visit (HOSPITAL_COMMUNITY): Payer: Self-pay

## 2023-06-23 ENCOUNTER — Encounter: Payer: Self-pay | Admitting: Hematology and Oncology

## 2023-06-23 ENCOUNTER — Inpatient Hospital Stay: Payer: BC Managed Care – PPO | Attending: Hematology and Oncology

## 2023-06-23 ENCOUNTER — Inpatient Hospital Stay: Payer: BC Managed Care – PPO | Admitting: Hematology and Oncology

## 2023-06-23 VITALS — BP 113/79 | HR 79 | Resp 18 | Ht 65.0 in | Wt 168.8 lb

## 2023-06-23 DIAGNOSIS — I2699 Other pulmonary embolism without acute cor pulmonale: Secondary | ICD-10-CM | POA: Diagnosis not present

## 2023-06-23 DIAGNOSIS — Z7901 Long term (current) use of anticoagulants: Secondary | ICD-10-CM | POA: Insufficient documentation

## 2023-06-23 LAB — CMP (CANCER CENTER ONLY)
ALT: 12 U/L (ref 0–44)
AST: 22 U/L (ref 15–41)
Albumin: 4.5 g/dL (ref 3.5–5.0)
Alkaline Phosphatase: 97 U/L (ref 38–126)
Anion gap: 5 (ref 5–15)
BUN: 14 mg/dL (ref 6–20)
CO2: 28 mmol/L (ref 22–32)
Calcium: 9.5 mg/dL (ref 8.9–10.3)
Chloride: 103 mmol/L (ref 98–111)
Creatinine: 0.87 mg/dL (ref 0.44–1.00)
GFR, Estimated: >60 mL/min (ref >60)
Glucose, Bld: 81 mg/dL (ref 70–99)
Potassium: 4.3 mmol/L (ref 3.5–5.1)
Sodium: 136 mmol/L (ref 135–145)
Total Bilirubin: 0.6 mg/dL (ref 0.0–1.2)
Total Protein: 7.4 g/dL (ref 6.5–8.1)

## 2023-06-23 LAB — CBC WITH DIFFERENTIAL (CANCER CENTER ONLY)
Abs Immature Granulocytes: 0 10*3/uL (ref 0.00–0.07)
Basophils Absolute: 0 10*3/uL (ref 0.0–0.1)
Basophils Relative: 1 %
Eosinophils Absolute: 0 10*3/uL (ref 0.0–0.5)
Eosinophils Relative: 1 %
HCT: 37 % (ref 36.0–46.0)
Hemoglobin: 12.8 g/dL (ref 12.0–15.0)
Immature Granulocytes: 0 %
Lymphocytes Relative: 39 %
Lymphs Abs: 1.5 10*3/uL (ref 0.7–4.0)
MCH: 32 pg (ref 26.0–34.0)
MCHC: 34.6 g/dL (ref 30.0–36.0)
MCV: 92.5 fL (ref 80.0–100.0)
Monocytes Absolute: 0.3 10*3/uL (ref 0.1–1.0)
Monocytes Relative: 8 %
Neutro Abs: 2 10*3/uL (ref 1.7–7.7)
Neutrophils Relative %: 51 %
Platelet Count: 293 10*3/uL (ref 150–400)
RBC: 4 MIL/uL (ref 3.87–5.11)
RDW: 12.4 % (ref 11.5–15.5)
WBC Count: 3.9 10*3/uL — ABNORMAL LOW (ref 4.0–10.5)
nRBC: 0 % (ref 0.0–0.2)

## 2023-06-23 NOTE — Assessment & Plan Note (Addendum)
 I have to identify several risk factors as causes of her recent provoked pulmonary embolism  Since her diagnosis, her nuva ring has been removed and she has started to drink more liquids to avoid dehydration We discussed risk and benefits of discontinuation of Eliquis  once her prescription runs out and she is in agreement For now, she has completed almost 6 months of anticoagulation therapy I recommend reducing the dose of 2.5 mg twice daily until her prescription runs out We discussed the use of 162 mg aspirin for secondary prevention and she is in agreement to switch over once her current prescription runs out We also discussed avoidance of artificial sweetener that has been associated with risk of clotting We also discussed importance of frequent ambulation when she started traveling again She does not need genetic testing to look for thrombophilia disorder She does not need long-term follow-up with me but I will see her in the future if she needs perioperative anticoagulation management

## 2023-06-23 NOTE — Progress Notes (Signed)
 [facility] OFF[facility] PROGRESS NOTE  Patient Care Team: [name], MD as PCP - General (Family Medicine)  Assessment & Plan Pulmonary embolism on left [facility]) I have to identify several risk factors as causes of her recent provoked pulmonary embolism  Since her diagnosis, her nuva ring has been removed and she has started to drink more liquids to avoid dehydration We discussed risk and benefits of discontinuation of Eliquis  once her prescription runs out and she is in agreement For now, she has completed almost 6 months of anticoagulation therapy I recommend reducing the dose of 2.5 mg twice daily until her prescription runs out We discussed the use of 162 mg aspirin for secondary prevention and she is in agreement to switch over once her current prescription runs out We also discussed avoidance of artificial sweetener that has been associated with risk of clotting We also discussed importance of frequent ambulation when she started traveling again She does not need genetic testing to look for thrombophilia disorder She does not need long-term follow-up with me but I will see her in the future if she needs perioperative anticoagulation management   No orders of the defined types were placed in this encounter.    Marissa Jacobs, MD  INTERVAL HISTORY: she returns for surveillance follow-up for history of DVT/PE Patient denies recent bleeding such as epistaxis, hematuria or hematochezia We reviewed medication list and discussed medication changes We discussed test results and future plan of care as outlined above  PHYSICAL EXAMINATION: ECOG PERFORMANCE STATUS: 0 - Asymptomatic  Vitals:   06/23/23 1105  BP: 113/79  Pulse: 79  Resp: 18  SpO2: 100%   Lab Results  Component Value Date   WBC 3.9 (L) 06/23/2023   HGB 12.8 06/23/2023   HCT 37.0 06/23/2023   MCV 92.5 06/23/2023   PLT 293 06/23/2023    SUMMARY OF HEMATOLOGIC HISTORY:  Marissa Marshall 58 y.o. female is  here because of recent diagnosis of PE The patient had background history of rheumatoid arthritis and has been on methotrexate for a while In September, she underwent breast biopsy, result was benign Otherwise, she is up-to-date with her age-appropriate screening The patient travels quite a bit prior to her diagnosis of PE In August, she flew to [place].  In September, she flew to [place]  and also to [place] . She presented to the urgent care after presentation of chest/rib pain  She was then directed to the ER On December 25, 2022, CT pulmonary angiogram showed Diffuse left lower lobe pulmonary emboli without evidence of right heart strain.   Changes in the left lower lobe likely related to the central pulmonary emboli and possibly representing early changes of pulmonary infarct.   Right solid pulmonary nodule measuring 3 mm. Per Fleischner Society Guidelines, no routine follow-up imaging is recommended. She was placed on anticoagulation therapy.  Her chest pain resolved within the month.  Currently, she denies shortness of breath.   Prior to the diagnosis, she denies lower extremity swelling, warmth, tenderness & erythema.   She does not smoke.  She is placed on NuvaRing for a long time initially for contraceptive purposes Since the diagnosis, the NuvaRing was removed  She had no prior history or diagnosis of cancer. Her age appropriate screening programs are up-to-date. She had prior surgeries before and never had perioperative thromboembolic events.  The patient had been pregnant before and denies history of peripartum thromboembolic event or history of recurrent miscarriages. There is no family history of blood clots  or miscarriages, except for stroke and a paternal aunt

## 2023-06-26 ENCOUNTER — Other Ambulatory Visit (HOSPITAL_COMMUNITY): Payer: Self-pay

## 2023-06-26 MED ORDER — ZEPBOUND 7.5 MG/0.5ML ~~LOC~~ SOAJ
7.5000 mg | SUBCUTANEOUS | 1 refills | Status: DC
  Filled 2023-06-26: qty 2, 28d supply, fill #0
  Filled 2023-08-07: qty 2, 28d supply, fill #1

## 2023-07-28 DIAGNOSIS — J359 Chronic disease of tonsils and adenoids, unspecified: Secondary | ICD-10-CM | POA: Diagnosis not present

## 2023-07-29 DIAGNOSIS — M0579 Rheumatoid arthritis with rheumatoid factor of multiple sites without organ or systems involvement: Secondary | ICD-10-CM | POA: Diagnosis not present

## 2023-07-29 DIAGNOSIS — H9311 Tinnitus, right ear: Secondary | ICD-10-CM | POA: Diagnosis not present

## 2023-07-29 DIAGNOSIS — R682 Dry mouth, unspecified: Secondary | ICD-10-CM | POA: Diagnosis not present

## 2023-07-29 DIAGNOSIS — M722 Plantar fascial fibromatosis: Secondary | ICD-10-CM | POA: Diagnosis not present

## 2023-08-07 ENCOUNTER — Other Ambulatory Visit (HOSPITAL_COMMUNITY): Payer: Self-pay

## 2023-08-07 DIAGNOSIS — K219 Gastro-esophageal reflux disease without esophagitis: Secondary | ICD-10-CM | POA: Diagnosis not present

## 2023-08-07 DIAGNOSIS — R632 Polyphagia: Secondary | ICD-10-CM | POA: Diagnosis not present

## 2023-08-07 DIAGNOSIS — Z8639 Personal history of other endocrine, nutritional and metabolic disease: Secondary | ICD-10-CM | POA: Diagnosis not present

## 2023-08-07 DIAGNOSIS — M059 Rheumatoid arthritis with rheumatoid factor, unspecified: Secondary | ICD-10-CM | POA: Diagnosis not present

## 2023-08-13 ENCOUNTER — Other Ambulatory Visit (HOSPITAL_COMMUNITY): Payer: Self-pay

## 2023-08-13 ENCOUNTER — Encounter (HOSPITAL_COMMUNITY): Payer: Self-pay

## 2023-08-13 MED ORDER — ZEPBOUND 15 MG/0.5ML ~~LOC~~ SOAJ
15.0000 mg | SUBCUTANEOUS | 1 refills | Status: AC
  Filled 2023-08-13: qty 2, 28d supply, fill #0
  Filled 2023-10-29: qty 2, 28d supply, fill #1

## 2023-08-18 ENCOUNTER — Other Ambulatory Visit (HOSPITAL_COMMUNITY): Payer: Self-pay

## 2023-10-13 ENCOUNTER — Other Ambulatory Visit: Payer: Self-pay | Admitting: Family Medicine

## 2023-10-13 DIAGNOSIS — E782 Mixed hyperlipidemia: Secondary | ICD-10-CM | POA: Diagnosis not present

## 2023-10-13 DIAGNOSIS — Z6827 Body mass index (BMI) 27.0-27.9, adult: Secondary | ICD-10-CM | POA: Diagnosis not present

## 2023-10-13 DIAGNOSIS — Z1231 Encounter for screening mammogram for malignant neoplasm of breast: Secondary | ICD-10-CM

## 2023-10-13 DIAGNOSIS — Z8639 Personal history of other endocrine, nutritional and metabolic disease: Secondary | ICD-10-CM | POA: Diagnosis not present

## 2023-10-13 DIAGNOSIS — E663 Overweight: Secondary | ICD-10-CM | POA: Diagnosis not present

## 2023-10-13 DIAGNOSIS — R921 Mammographic calcification found on diagnostic imaging of breast: Secondary | ICD-10-CM

## 2023-10-13 DIAGNOSIS — Z09 Encounter for follow-up examination after completed treatment for conditions other than malignant neoplasm: Secondary | ICD-10-CM

## 2023-10-15 DIAGNOSIS — Z6827 Body mass index (BMI) 27.0-27.9, adult: Secondary | ICD-10-CM | POA: Diagnosis not present

## 2023-10-15 DIAGNOSIS — N912 Amenorrhea, unspecified: Secondary | ICD-10-CM | POA: Diagnosis not present

## 2023-10-15 DIAGNOSIS — N390 Urinary tract infection, site not specified: Secondary | ICD-10-CM | POA: Diagnosis not present

## 2023-10-15 DIAGNOSIS — R102 Pelvic and perineal pain: Secondary | ICD-10-CM | POA: Diagnosis not present

## 2023-10-27 DIAGNOSIS — M0579 Rheumatoid arthritis with rheumatoid factor of multiple sites without organ or systems involvement: Secondary | ICD-10-CM | POA: Diagnosis not present

## 2023-10-29 ENCOUNTER — Other Ambulatory Visit (HOSPITAL_COMMUNITY): Payer: Self-pay

## 2023-11-10 ENCOUNTER — Ambulatory Visit
Admission: RE | Admit: 2023-11-10 | Discharge: 2023-11-10 | Disposition: A | Discharge status: Home / Self Care | Source: Ambulatory Visit | Attending: Family Medicine | Admitting: Family Medicine

## 2023-11-10 ENCOUNTER — Other Ambulatory Visit: Payer: Self-pay | Admitting: Family Medicine

## 2023-11-10 DIAGNOSIS — R928 Other abnormal and inconclusive findings on diagnostic imaging of breast: Secondary | ICD-10-CM | POA: Diagnosis not present

## 2023-11-10 DIAGNOSIS — R921 Mammographic calcification found on diagnostic imaging of breast: Secondary | ICD-10-CM

## 2023-11-10 DIAGNOSIS — Z09 Encounter for follow-up examination after completed treatment for conditions other than malignant neoplasm: Secondary | ICD-10-CM

## 2023-11-10 DIAGNOSIS — R92323 Mammographic fibroglandular density, bilateral breasts: Secondary | ICD-10-CM | POA: Diagnosis not present

## 2023-11-12 ENCOUNTER — Telehealth: Payer: Self-pay

## 2023-11-12 ENCOUNTER — Other Ambulatory Visit: Payer: Self-pay | Admitting: Hematology and Oncology

## 2023-11-12 ENCOUNTER — Encounter: Payer: Self-pay | Admitting: Hematology and Oncology

## 2023-11-12 DIAGNOSIS — I2699 Other pulmonary embolism without acute cor pulmonale: Secondary | ICD-10-CM

## 2023-11-12 NOTE — Telephone Encounter (Signed)
 S/w patient regarding concerns over new bruising around ankle.  Patient denies any trauma to the site. No warmth or swelling noted. Patient reports only tenderness to the touch, but does not limit ADLs. No SOB or chest pain reported.  Patient scheduled to be evaluated with Dr. Lonn next week. Request that patient send in pictures of site of bruising.  Patient verbalized an understanding of the information and confirmed appointment date and times. Reviewed strict ED precautions.

## 2023-11-19 ENCOUNTER — Inpatient Hospital Stay: Attending: Hematology and Oncology

## 2023-11-19 ENCOUNTER — Encounter: Payer: Self-pay | Admitting: Hematology and Oncology

## 2023-11-19 ENCOUNTER — Inpatient Hospital Stay: Admitting: Hematology and Oncology

## 2023-11-19 VITALS — BP 121/94 | HR 85 | Resp 18 | Ht 65.0 in | Wt 166.2 lb

## 2023-11-19 DIAGNOSIS — I2699 Other pulmonary embolism without acute cor pulmonale: Secondary | ICD-10-CM

## 2023-11-19 DIAGNOSIS — R233 Spontaneous ecchymoses: Secondary | ICD-10-CM | POA: Diagnosis not present

## 2023-11-19 DIAGNOSIS — Z86711 Personal history of pulmonary embolism: Secondary | ICD-10-CM | POA: Insufficient documentation

## 2023-11-19 DIAGNOSIS — Z7982 Long term (current) use of aspirin: Secondary | ICD-10-CM | POA: Diagnosis not present

## 2023-11-19 LAB — PROTIME-INR
INR: 1 (ref 0.8–1.2)
Prothrombin Time: 13.7 s (ref 11.4–15.2)

## 2023-11-19 LAB — CMP (CANCER CENTER ONLY)
ALT: 9 U/L (ref 0–44)
AST: 20 U/L (ref 15–41)
Albumin: 4.3 g/dL (ref 3.5–5.0)
Alkaline Phosphatase: 118 U/L (ref 38–126)
Anion gap: 5 (ref 5–15)
BUN: 17 mg/dL (ref 6–20)
CO2: 25 mmol/L (ref 22–32)
Calcium: 9.3 mg/dL (ref 8.9–10.3)
Chloride: 108 mmol/L (ref 98–111)
Creatinine: 0.86 mg/dL (ref 0.44–1.00)
GFR, Estimated: >60 mL/min (ref >60)
Glucose, Bld: 84 mg/dL (ref 70–99)
Potassium: 3.8 mmol/L (ref 3.5–5.1)
Sodium: 138 mmol/L (ref 135–145)
Total Bilirubin: 0.4 mg/dL (ref 0.0–1.2)
Total Protein: 7.4 g/dL (ref 6.5–8.1)

## 2023-11-19 LAB — CBC WITH DIFFERENTIAL (CANCER CENTER ONLY)
Abs Immature Granulocytes: 0.01 K/uL (ref 0.00–0.07)
Basophils Absolute: 0.1 K/uL (ref 0.0–0.1)
Basophils Relative: 1 %
Eosinophils Absolute: 0.1 K/uL (ref 0.0–0.5)
Eosinophils Relative: 1 %
HCT: 36.3 % (ref 36.0–46.0)
Hemoglobin: 12.3 g/dL (ref 12.0–15.0)
Immature Granulocytes: 0 %
Lymphocytes Relative: 35 %
Lymphs Abs: 1.9 K/uL (ref 0.7–4.0)
MCH: 31.4 pg (ref 26.0–34.0)
MCHC: 33.9 g/dL (ref 30.0–36.0)
MCV: 92.6 fL (ref 80.0–100.0)
Monocytes Absolute: 0.3 K/uL (ref 0.1–1.0)
Monocytes Relative: 6 %
Neutro Abs: 3.1 K/uL (ref 1.7–7.7)
Neutrophils Relative %: 57 %
Platelet Count: 297 K/uL (ref 150–400)
RBC: 3.92 MIL/uL (ref 3.87–5.11)
RDW: 12.2 % (ref 11.5–15.5)
WBC Count: 5.4 K/uL (ref 4.0–10.5)
nRBC: 0 % (ref 0.0–0.2)

## 2023-11-19 LAB — APTT: aPTT: 26 s (ref 24–36)

## 2023-11-19 NOTE — Assessment & Plan Note (Addendum)
 She will continue 81 mg aspirin for secondary prevention

## 2023-11-19 NOTE — Assessment & Plan Note (Addendum)
 Multifactorial, likely due to daily aspirin, intermittent use of NSAID, active lifestyle and side effects of methotrexate Repeat CBC is normal Will check some coag studies The patient is reassured She does not need long-term follow-up

## 2023-11-19 NOTE — Progress Notes (Signed)
 [facility] OFFICE PROGRESS NOTE  Patient Care Team: Aisha Harvey, MD as PCP - General [facility] Medicine)  Assessment & Plan Easy bruising Multifactorial, likely due to daily aspirin, intermittent use of NSAID, active lifestyle and side effects of methotrexate Repeat CBC is normal Will check some coag studies The patient is reassured She does not need long-term follow-up Pulmonary embolism on left [facility]) She will continue 81 mg aspirin for secondary prevention  No orders of the defined types were placed in this encounter.    Almarie Bedford, MD  INTERVAL HISTORY: she returns for follow-up due to concern for bruises She sent me pictures on MyChart She noted bruises on her left lower extremity She did not recall recent trauma or injuries No petechiae The patient denies any recent signs or symptoms of bleeding such as spontaneous epistaxis, hematuria or hematochezia. She is taking methotrexate, aspirin, intermittent NSAID No other over-the-counter supplement  PHYSICAL EXAMINATION: ECOG PERFORMANCE STATUS: 0 - Asymptomatic  Vitals:   11/19/23 1436  BP: (!) 121/94  Pulse: 85  Resp: 18  SpO2: 100%   Filed Weights   11/19/23 1436  Weight: 166 lb 3.2 oz (75.4 kg)   Noted some minor skin bruises Relevant data reviewed during this visit included CBC, CMP, coags

## 2023-12-08 DIAGNOSIS — R3915 Urgency of urination: Secondary | ICD-10-CM | POA: Diagnosis not present

## 2023-12-08 DIAGNOSIS — N390 Urinary tract infection, site not specified: Secondary | ICD-10-CM | POA: Diagnosis not present

## 2023-12-15 DIAGNOSIS — R3915 Urgency of urination: Secondary | ICD-10-CM | POA: Diagnosis not present

## 2023-12-28 ENCOUNTER — Other Ambulatory Visit: Payer: Self-pay | Admitting: Family Medicine

## 2023-12-28 DIAGNOSIS — R911 Solitary pulmonary nodule: Secondary | ICD-10-CM

## 2023-12-31 ENCOUNTER — Other Ambulatory Visit: Payer: Self-pay | Admitting: Medical Genetics

## 2023-12-31 DIAGNOSIS — Z006 Encounter for examination for normal comparison and control in clinical research program: Secondary | ICD-10-CM

## 2024-01-01 ENCOUNTER — Ambulatory Visit
Admission: RE | Admit: 2024-01-01 | Discharge: 2024-01-01 | Disposition: A | Discharge status: Home / Self Care | Source: Ambulatory Visit | Attending: Family Medicine | Admitting: Family Medicine

## 2024-01-01 DIAGNOSIS — J984 Other disorders of lung: Secondary | ICD-10-CM | POA: Diagnosis not present

## 2024-01-01 DIAGNOSIS — R911 Solitary pulmonary nodule: Secondary | ICD-10-CM

## 2024-01-19 DIAGNOSIS — K5903 Drug induced constipation: Secondary | ICD-10-CM | POA: Diagnosis not present

## 2024-01-19 DIAGNOSIS — M0579 Rheumatoid arthritis with rheumatoid factor of multiple sites without organ or systems involvement: Secondary | ICD-10-CM | POA: Diagnosis not present

## 2024-01-19 DIAGNOSIS — R682 Dry mouth, unspecified: Secondary | ICD-10-CM | POA: Diagnosis not present

## 2024-01-19 DIAGNOSIS — M722 Plantar fascial fibromatosis: Secondary | ICD-10-CM | POA: Diagnosis not present

## 2024-01-19 DIAGNOSIS — E782 Mixed hyperlipidemia: Secondary | ICD-10-CM | POA: Diagnosis not present

## 2024-01-19 DIAGNOSIS — H9311 Tinnitus, right ear: Secondary | ICD-10-CM | POA: Diagnosis not present

## 2024-01-19 DIAGNOSIS — Z8639 Personal history of other endocrine, nutritional and metabolic disease: Secondary | ICD-10-CM | POA: Diagnosis not present

## 2024-01-19 DIAGNOSIS — M069 Rheumatoid arthritis, unspecified: Secondary | ICD-10-CM | POA: Diagnosis not present

## 2024-02-04 DIAGNOSIS — M25512 Pain in left shoulder: Secondary | ICD-10-CM | POA: Diagnosis not present

## 2024-02-04 DIAGNOSIS — M25511 Pain in right shoulder: Secondary | ICD-10-CM | POA: Diagnosis not present

## 2024-02-09 DIAGNOSIS — M25511 Pain in right shoulder: Secondary | ICD-10-CM | POA: Diagnosis not present

## 2024-02-09 DIAGNOSIS — M25512 Pain in left shoulder: Secondary | ICD-10-CM | POA: Diagnosis not present

## 2024-02-16 DIAGNOSIS — M25512 Pain in left shoulder: Secondary | ICD-10-CM | POA: Diagnosis not present

## 2024-02-16 DIAGNOSIS — M25511 Pain in right shoulder: Secondary | ICD-10-CM | POA: Diagnosis not present

## 2024-02-19 ENCOUNTER — Ambulatory Visit: Admitting: Family Medicine

## 2024-02-23 DIAGNOSIS — Z1330 Encounter for screening examination for mental health and behavioral disorders, unspecified: Secondary | ICD-10-CM | POA: Diagnosis not present

## 2024-02-23 DIAGNOSIS — N958 Other specified menopausal and perimenopausal disorders: Secondary | ICD-10-CM | POA: Diagnosis not present

## 2024-02-23 DIAGNOSIS — Z124 Encounter for screening for malignant neoplasm of cervix: Secondary | ICD-10-CM | POA: Diagnosis not present

## 2024-02-23 DIAGNOSIS — Z01419 Encounter for gynecological examination (general) (routine) without abnormal findings: Secondary | ICD-10-CM | POA: Diagnosis not present

## 2024-02-24 DIAGNOSIS — M25512 Pain in left shoulder: Secondary | ICD-10-CM | POA: Diagnosis not present

## 2024-02-24 DIAGNOSIS — M25511 Pain in right shoulder: Secondary | ICD-10-CM | POA: Diagnosis not present

## 2024-03-01 DIAGNOSIS — M25511 Pain in right shoulder: Secondary | ICD-10-CM | POA: Diagnosis not present

## 2024-03-01 DIAGNOSIS — M25512 Pain in left shoulder: Secondary | ICD-10-CM | POA: Diagnosis not present

## 2024-03-01 LAB — HM PAP SMEAR: HPV, high-risk: NEGATIVE

## 2024-03-04 ENCOUNTER — Encounter: Payer: Self-pay | Admitting: Hematology and Oncology

## 2024-03-10 ENCOUNTER — Ambulatory Visit: Admitting: Family Medicine

## 2024-03-15 ENCOUNTER — Other Ambulatory Visit: Payer: Self-pay | Admitting: Medical Genetics

## 2024-03-15 ENCOUNTER — Encounter: Payer: Self-pay | Admitting: Family Medicine

## 2024-03-15 ENCOUNTER — Ambulatory Visit: Admitting: Family Medicine

## 2024-03-15 ENCOUNTER — Other Ambulatory Visit (HOSPITAL_COMMUNITY): Payer: Self-pay

## 2024-03-15 VITALS — BP 120/68 | HR 77 | Ht 65.0 in | Wt 162.4 lb

## 2024-03-15 DIAGNOSIS — G43009 Migraine without aura, not intractable, without status migrainosus: Secondary | ICD-10-CM

## 2024-03-15 DIAGNOSIS — Z23 Encounter for immunization: Secondary | ICD-10-CM

## 2024-03-15 DIAGNOSIS — M7581 Other shoulder lesions, right shoulder: Secondary | ICD-10-CM

## 2024-03-15 DIAGNOSIS — Z780 Asymptomatic menopausal state: Secondary | ICD-10-CM

## 2024-03-15 DIAGNOSIS — Z006 Encounter for examination for normal comparison and control in clinical research program: Secondary | ICD-10-CM

## 2024-03-15 DIAGNOSIS — Z8639 Personal history of other endocrine, nutritional and metabolic disease: Secondary | ICD-10-CM

## 2024-03-15 DIAGNOSIS — Z6827 Body mass index (BMI) 27.0-27.9, adult: Secondary | ICD-10-CM | POA: Diagnosis not present

## 2024-03-15 DIAGNOSIS — Z636 Dependent relative needing care at home: Secondary | ICD-10-CM

## 2024-03-15 DIAGNOSIS — Z79899 Other long term (current) drug therapy: Secondary | ICD-10-CM | POA: Diagnosis not present

## 2024-03-15 DIAGNOSIS — N952 Postmenopausal atrophic vaginitis: Secondary | ICD-10-CM

## 2024-03-15 LAB — COMPREHENSIVE METABOLIC PANEL WITH GFR
ALT: 14 U/L (ref 3–35)
AST: 24 U/L (ref 5–37)
Albumin: 4.4 g/dL (ref 3.5–5.2)
Alkaline Phosphatase: 100 U/L (ref 39–117)
BUN: 14 mg/dL (ref 6–23)
CO2: 26 meq/L (ref 19–32)
Calcium: 9.7 mg/dL (ref 8.4–10.5)
Chloride: 104 meq/L (ref 96–112)
Creatinine, Ser: 0.92 mg/dL (ref 0.40–1.20)
GFR: 69.46 mL/min
Glucose, Bld: 87 mg/dL (ref 70–99)
Potassium: 4.4 meq/L (ref 3.5–5.1)
Sodium: 137 meq/L (ref 135–145)
Total Bilirubin: 0.6 mg/dL (ref 0.2–1.2)
Total Protein: 7.7 g/dL (ref 6.0–8.3)

## 2024-03-15 LAB — LIPID PANEL
Cholesterol: 233 mg/dL — ABNORMAL HIGH (ref 28–200)
HDL: 69.3 mg/dL
LDL Cholesterol: 141 mg/dL — ABNORMAL HIGH (ref 10–99)
NonHDL: 163.34
Total CHOL/HDL Ratio: 3
Triglycerides: 110 mg/dL (ref 10.0–149.0)
VLDL: 22 mg/dL (ref 0.0–40.0)

## 2024-03-15 LAB — CBC WITH DIFFERENTIAL/PLATELET
Basophils Absolute: 0 K/uL (ref 0.0–0.1)
Basophils Relative: 1.1 % (ref 0.0–3.0)
Eosinophils Absolute: 0 K/uL (ref 0.0–0.7)
Eosinophils Relative: 0.7 % (ref 0.0–5.0)
HCT: 39.8 % (ref 36.0–46.0)
Hemoglobin: 13.5 g/dL (ref 12.0–15.0)
Lymphocytes Relative: 35.9 % (ref 12.0–46.0)
Lymphs Abs: 1.5 K/uL (ref 0.7–4.0)
MCHC: 34 g/dL (ref 30.0–36.0)
MCV: 92.9 fl (ref 78.0–100.0)
Monocytes Absolute: 0.3 K/uL (ref 0.1–1.0)
Monocytes Relative: 6.8 % (ref 3.0–12.0)
Neutro Abs: 2.3 K/uL (ref 1.4–7.7)
Neutrophils Relative %: 55.5 % (ref 43.0–77.0)
Platelets: 302 K/uL (ref 150.0–400.0)
RBC: 4.28 Mil/uL (ref 3.87–5.11)
RDW: 13.4 % (ref 11.5–15.5)
WBC: 4.2 K/uL (ref 4.0–10.5)

## 2024-03-15 MED ORDER — ALPRAZOLAM 0.25 MG PO TABS
0.2500 mg | ORAL_TABLET | Freq: Every day | ORAL | 0 refills | Status: AC | PRN
  Filled 2024-03-15: qty 15, 15d supply, fill #0

## 2024-03-15 NOTE — Progress Notes (Incomplete)
 "    Patient Care Team: Aisha Harvey, MD as PCP - General (Family Medicine)  Weight Management:   Starting weight: 214lb Starting date: 08/28/2021 Today's weight: 162lb Today's date: 03/15/2024 Total lbs lost to date: 52lb Total lbs lost since last in-office visit: +1 Total weight loss percentage to date: -24.30% There is no height or weight on file to calculate BMI.   Resting Metabolic Rate: *** Nutrition Plan: {EW NUTRITION HNJOD:65522}. Anti-obesity medications (including off-label): ***. Reported side effects: ***. Hunger is {EWCONTROLASSESSMENT:24261}. Cravings are {EWCONTROLASSESSMENT:24261}. Activity: {MWM EXERCISE RECS:23473}. Sleep: Number of hours slept each night: ***. Sleep {ACTION; IS/IS NOT:21021397} restful.   Diagnoses and Orders:   No diagnosis found. No orders of the defined types were placed in this encounter.  No orders of the defined types were placed in this encounter.  Assessment & Plan:   Assessment and Plan Assessment & Plan     Geni Shutter, DO, MS, FAAFP, Dipl. [facility] [address] [place], [address] Dept: [phone] Dept Fax: [phone]  Subjective:   History of Present Illness   Review of Systems: Negative, with the exception of above mentioned in HPI.  History:   Reviewed by clinician on day of visit: allergies, medications, problem list, medical history, surgical history, family history, social history, and previous encounter notes.  Medications:   Show/hide medication list[1] Allergies[2]  Objective:   There were no vitals taken for this visit. {Insert last BP/Wt (optional):23777}{See vitals history (optional):1}   Physical Exam {Insert previous labs (optional):23779} {See past labs  Heme  Chem  Endocrine  Serology  Results Review (optional):1}  Results for orders placed or performed in visit on 11/19/23  CMP (Cancer Center only)   Collection  Time: 11/19/23  2:27 PM  Result Value Ref Range   Sodium 138 135 - 145 mmol/L   Potassium 3.8 3.5 - 5.1 mmol/L   Chloride 108 98 - 111 mmol/L   CO2 25 22 - 32 mmol/L   Glucose, Bld 84 70 - 99 mg/dL   BUN 17 6 - 20 mg/dL   Creatinine 9.13 9.55 - 1.00 mg/dL   Calcium 9.3 8.9 - 89.6 mg/dL   Total Protein 7.4 6.5 - 8.1 g/dL   Albumin 4.3 3.5 - 5.0 g/dL   AST 20 15 - 41 U/L   ALT 9 0 - 44 U/L   Alkaline Phosphatase 118 38 - 126 U/L   Total Bilirubin 0.4 0.0 - 1.2 mg/dL   GFR, Estimated >39 >39 mL/min   Anion gap 5 5 - 15  CBC with Differential (Cancer Center Only)   Collection Time: 11/19/23  2:27 PM  Result Value Ref Range   WBC Count 5.4 4.0 - 10.5 K/uL   RBC 3.92 3.87 - 5.11 MIL/uL   Hemoglobin 12.3 12.0 - 15.0 g/dL   HCT 63.6 63.9 - 53.9 %   MCV 92.6 80.0 - 100.0 fL   MCH 31.4 26.0 - 34.0 pg   MCHC 33.9 30.0 - 36.0 g/dL   RDW 87.7 88.4 - 84.4 %   Platelet Count 297 150 - 400 K/uL   nRBC 0.0 0.0 - 0.2 %   Neutrophils Relative % 57 %   Neutro Abs 3.1 1.7 - 7.7 K/uL   Lymphocytes Relative 35 %   Lymphs Abs 1.9 0.7 - 4.0 K/uL   Monocytes Relative 6 %   Monocytes Absolute 0.3 0.1 - 1.0 K/uL   Eosinophils Relative 1 %   Eosinophils Absolute  0.1 0.0 - 0.5 K/uL   Basophils Relative 1 %   Basophils Absolute 0.1 0.0 - 0.1 K/uL   Immature Granulocytes 0 %   Abs Immature Granulocytes 0.01 0.00 - 0.07 K/uL  Protime-INR   Collection Time: 11/19/23  2:27 PM  Result Value Ref Range   Prothrombin Time 13.7 11.4 - 15.2 seconds   INR 1.0 0.8 - 1.2  APTT   Collection Time: 11/19/23  2:27 PM  Result Value Ref Range   aPTT 26 24 - 36 seconds    03/15/2024    PHQ2-9 Depression Screening   Little interest or pleasure in doing things    Feeling down, depressed, or hopeless    PHQ-2 - Total Score    Trouble falling or staying asleep, or sleeping too much    Feeling tired or having little energy    Poor appetite or overeating     Feeling bad about yourself - or that you are a failure  or have let yourself or your family down    Trouble concentrating on things, such as reading the newspaper or watching television    Moving or speaking so slowly that other people could have noticed.  Or the opposite - being so fidgety or restless that you have been moving around a lot more than usual    Thoughts that you would be better off dead, or hurting yourself in some way    PHQ2-9 Total Score    If you checked off any problems, how difficult have these problems made it for you to do your work, take care of things at home, or get along with other people    Depression Interventions/Treatment          No data to display         Attestations:   {EW ATTESTATIONS:34266}  Geni Shutter, DO, MS, FAAFP, Dipl. [facility] [address] [place], [address] Dept: [phone] Dept Fax: [phone]     [1]  Outpatient Medications Prior to Visit  Medication Sig   ALPRAZolam  (XANAX ) 0.25 MG tablet Take 1 tablet (0.25 mg total) by mouth daily as needed for anxiety.   aspirin EC 81 MG tablet Take 81 mg by mouth daily. Swallow whole.   folic acid  (FOLVITE ) 400 MCG tablet Take 400 mcg by mouth daily.   methotrexate (RHEUMATREX) 2.5 MG tablet Take 15 mg by mouth every Wednesday.   tirzepatide  (ZEPBOUND ) 15 MG/0.5ML Pen Inject 15 mg into the skin once a week.   tirzepatide  (ZEPBOUND ) 5 MG/0.5ML Pen Inject 5 mg into the skin once a week.   tirzepatide  (ZEPBOUND ) 7.5 MG/0.5ML Pen Inject 7.5 mg into the skin once a week.   No facility-administered medications prior to visit.  [2]  Allergies Allergen Reactions   Ciprofloxacin Other (See Comments)    Severe joint pain   Tetracyclines & Related Other (See Comments)    Bad headaches   Sulfa Antibiotics Rash and Other (See Comments)    Bad rash   "

## 2024-03-15 NOTE — Addendum Note (Signed)
 Addended by: REMUS NOEMI PARAS on: 03/15/2024 09:25 AM   Modules accepted: Orders

## 2024-03-28 ENCOUNTER — Encounter: Payer: Self-pay | Admitting: Family Medicine

## 2024-03-28 DIAGNOSIS — Z780 Asymptomatic menopausal state: Secondary | ICD-10-CM | POA: Insufficient documentation

## 2024-03-28 DIAGNOSIS — G43009 Migraine without aura, not intractable, without status migrainosus: Secondary | ICD-10-CM | POA: Insufficient documentation

## 2024-03-28 DIAGNOSIS — M7581 Other shoulder lesions, right shoulder: Secondary | ICD-10-CM | POA: Insufficient documentation

## 2024-03-28 DIAGNOSIS — Z6827 Body mass index (BMI) 27.0-27.9, adult: Secondary | ICD-10-CM | POA: Insufficient documentation

## 2024-03-28 DIAGNOSIS — R87610 Atypical squamous cells of undetermined significance on cytologic smear of cervix (ASC-US): Secondary | ICD-10-CM | POA: Insufficient documentation

## 2024-03-28 DIAGNOSIS — Z8639 Personal history of other endocrine, nutritional and metabolic disease: Secondary | ICD-10-CM | POA: Insufficient documentation

## 2024-03-28 DIAGNOSIS — Z636 Dependent relative needing care at home: Secondary | ICD-10-CM | POA: Insufficient documentation

## 2024-03-28 NOTE — Telephone Encounter (Signed)
 When pt was in the office for her visit on 03/15/2024, I let her know that we could get her GeneConnect blood work drawn. I went through the steps to order the blood work while I was working her up, but when I look now at her labs, it doesn't look like it was drawn. I am not sure why though. The order was put back in future. Do you know what could have happened here?

## 2024-03-28 NOTE — Progress Notes (Signed)
 "    Patient Care Team: Prentiss Frieze, DO as PCP - General (Family Medicine)  Weight Management:   Starting weight: 214lb Starting date: 08/28/2021 Today's weight: 162lb Today's date: 03/28/2024 Total lbs lost to date: 52lb Total lbs lost since last in-office visit: +1 Total weight loss percentage to date: -24.30% Body mass index is 27.02 kg/m.   Nutrition Plan: Goal: 1200-1500 calories/85 g protein. Mindful portions and Nutrition consistency over perfection. Anti-obesity medications (including off-label): Zepbound  5 mg (17 units from 15 mg vial) q 10-11 days. Reported side effects: None. Maintaining weight loss. Activity: Peloton three times a week and light upper body weights 3-4 times per week, recently had a grandson being born and has slowed down, walking.    Patient is under the care of an Obesity Medicine-certified physician providing longitudinal care using a comprehensive, pillar-based obesity treatment model (nutrition therapy, physical activity counseling, behavioral modification, pharmacotherapy when indicated, and medical monitoring), consistent with standards outlined by the Obesity Medicine Association. Obesity is treated as a medical disease, not a cosmetic condition. Medication choice is supported by evidence demonstrating improvement in weight, cardiometabolic risk, and obesity-related complications.   Diagnoses and Orders:   1. Postmenopausal   2. Immunization due   3. Right shoulder tendonitis   4. Medication management   5. Migraine without aura and without status migrainosus, not intractable   6. Caregiver stress   7. History of obesity   8. Body mass index (BMI) of 27.0 to 27.9 in adult    Meds ordered this encounter  Medications   ALPRAZolam  (XANAX ) 0.25 MG tablet    Sig: Take 1 tablet (0.25 mg total) by mouth daily as needed for anxiety.    Dispense:  15 tablet    Refill:  0    PRN   Orders Placed This Encounter  Procedures   Flu vaccine trivalent  PF, 6mos and older(Flulaval,Afluria,Fluarix,Fluzone)   CBC with Differential/Platelet   Comprehensive metabolic panel with GFR   Lipid panel   Ambulatory referral to Sports Medicine   Assessment & Plan:   Assessment & Plan Right shoulder tendonitis Chronic shoulder pain likely due to tendonitis, exacerbated by lifting. No frozen shoulder. Differential includes tendinitis and inflammatory arthritis. - Referred to Sports Medicine for evaluation and possible steroid injection. - Continue physical therapy. - Consider assistive devices for holding the baby.  Menopausal symptoms with vaginal atrophy Vaginal dryness and hot flashes likely due to menopause. Vaginal estrogen inserts improved dryness but may contribute to hot flashes. - Increase vaginal estrogen insert frequency to three times a week. - Consider switching to vaginal estrogen cream if symptoms persist.  Overweight BMI 27. Maintaining weight with Zepbound , administered every 9 to 11 days. Insurance no longer covers Zepbound . Considering dosage adjustment due to hot flashes. - Continue current Zepbound  dosage for maintenance. - Monitor weight and symptoms, adjust dosage if necessary.  General Health Maintenance Routine health maintenance discussed, including cholesterol panel and colonoscopy scheduling. GeneConnect genetic testing discussed for breast cancer risk assessment. - Ordered cholesterol panel. - Scheduled colonoscopy for 2029. - Coordinated GeneConnect genetic testing.  Subjective:   History of Present Illness Marissa Marshall is a 59 year old female who presents for follow-up and management of her chronic conditions.  Migraine headache - Severe migraine occurred last week, described as among the worst experienced. - Migraine exacerbation followed discontinuation of migraine medication. - Medication has since been restarted.  Right shoulder pain - Persistent right shoulder pain treated with physical therapy,  including dry  needling. - Pain worsens with holding grandchild and with reaching back. - Movement limitations due to pain. - No x-ray or injection performed to date.  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  Menopausal symptoms and hormone therapy - Uses estradiol  prescribed by gynecology. - Experiencing hot flashes since discontinuing Nuvaring. - Uses vaginal estrogen twice weekly for vaginal dryness and prevention of recurrent urinary tract infections.  Psychological stress - Significant stress related to mother's dementia. - Has not yet seen a counselor. - Uses Xanax  as needed for flying or stress related to mother's situation; one pill remaining from last prescription.  Weight management - Maintaining weight on Zepbound , administered every 9 to 11 days. - Currently has extra supply of medication.  Genetic testing concerns - Participating in a clinical trial and has a Cytogeneticist at home. - Delayed use of kit due to concerns about Ashkenazi Jewish heritage and possible increased breast cancer risk.  Review of Systems: Negative, with the exception of above mentioned in HPI.  History:   Reviewed by clinician on day of visit: allergies, medications, problem list, medical history, surgical history, family history, social history, and previous encounter notes.  Medications:   Show/hide medication list[1] Allergies[2]  Objective:   BP 120/68 (BP Location: Left Arm, Cuff Size: Normal)   Pulse 77   Ht 5' 5 (1.651 m)   Wt 162 lb 6.4 oz (73.7 kg)   SpO2 99%   BMI 27.02 kg/m   Physical Exam Constitutional:      General: She is not in acute distress.    Appearance: She is well-developed.  HENT:     Head: Normocephalic and atraumatic.  Eyes:     Conjunctiva/sclera: Conjunctivae normal.  Cardiovascular:     Rate and Rhythm: Normal rate and regular rhythm.     Heart sounds: Normal heart sounds.  Pulmonary:     Effort:  Pulmonary effort is normal.     Breath sounds: Normal breath sounds.  Neurological:     General: No focal deficit present.     Mental Status: She is alert.  Psychiatric:        Behavior: Behavior normal.    Results for orders placed or performed in visit on 03/15/24  CBC with Differential/Platelet   Collection Time: 03/15/24  9:34 AM  Result Value Ref Range   WBC 4.2 4.0 - 10.5 K/uL   RBC 4.28 3.87 - 5.11 Mil/uL   Hemoglobin 13.5 12.0 - 15.0 g/dL   HCT 60.1 63.9 - 53.9 %   MCV 92.9 78.0 - 100.0 fl   MCHC 34.0 30.0 - 36.0 g/dL   RDW 86.5 88.4 - 84.4 %   Platelets 302.0 150.0 - 400.0 K/uL   Neutrophils Relative % 55.5 43.0 - 77.0 %   Lymphocytes Relative 35.9 12.0 - 46.0 %   Monocytes Relative 6.8 3.0 - 12.0 %   Eosinophils Relative 0.7 0.0 - 5.0 %   Basophils Relative 1.1 0.0 - 3.0 %   Neutro Abs 2.3 1.4 - 7.7 K/uL   Lymphs Abs 1.5 0.7 - 4.0 K/uL   Monocytes Absolute 0.3 0.1 - 1.0 K/uL   Eosinophils Absolute 0.0 0.0 - 0.7 K/uL   Basophils Absolute 0.0 0.0 - 0.1 K/uL  Comprehensive metabolic panel with GFR   Collection Time: 03/15/24  9:34 AM  Result Value Ref Range   Sodium 137 135 - 145 mEq/L   Potassium 4.4 3.5 - 5.1 mEq/L   Chloride 104  96 - 112 mEq/L   CO2 26 19 - 32 mEq/L   Glucose, Bld 87 70 - 99 mg/dL   BUN 14 6 - 23 mg/dL   Creatinine, Ser 9.07 0.40 - 1.20 mg/dL   Total Bilirubin 0.6 0.2 - 1.2 mg/dL   Alkaline Phosphatase 100 39 - 117 U/L   AST 24 5 - 37 U/L   ALT 14 3 - 35 U/L   Total Protein 7.7 6.0 - 8.3 g/dL   Albumin 4.4 3.5 - 5.2 g/dL   GFR 30.53 >39.99 mL/min   Calcium 9.7 8.4 - 10.5 mg/dL  Lipid panel   Collection Time: 03/15/24  9:34 AM  Result Value Ref Range   Cholesterol 233 (H) 28 - 200 mg/dL   Triglycerides 889.9 89.9 - 149.0 mg/dL   HDL 30.69 >60.99 mg/dL   VLDL 77.9 0.0 - 59.9 mg/dL   LDL Cholesterol 858 (H) 10 - 99 mg/dL   Total CHOL/HDL Ratio 3    NonHDL 163.34     03/28/2024    PHQ2-9 Depression Screening   Little interest or  pleasure in doing things Not at all  Feeling down, depressed, or hopeless Not at all  PHQ-2 - Total Score 0  Trouble falling or staying asleep, or sleeping too much Not at all  Feeling tired or having little energy Not at all  Poor appetite or overeating  Not at all  Feeling bad about yourself - or that you are a failure or have let yourself or your family down Not at all  Trouble concentrating on things, such as reading the newspaper or watching television Not at all  Moving or speaking so slowly that other people could have noticed.  Or the opposite - being so fidgety or restless that you have been moving around a lot more than usual Not at all  Thoughts that you would be better off dead, or hurting yourself in some way Not at all  PHQ2-9 Total Score 0  If you checked off any problems, how difficult have these problems made it for you to do your work, take care of things at home, or get along with other people    Depression Interventions/Treatment        03/15/2024    8:41 AM  GAD 7 : Generalized Anxiety Score  Nervous, Anxious, on Edge 1   Control/stop worrying 0   Worry too much - different things 1   Trouble relaxing 0   Restless 0   Easily annoyed or irritable 0   Afraid - awful might happen 0   Total GAD 7 Score 2  Anxiety Difficulty Not difficult at all     Data saved with a previous flowsheet row definition   Attestations:   Patient is well-known to me from previous care setting and is establishing care in this system with me as PCP. Available records reviewed. Chart updated today with reconciliation of problem list, medications, allergies, and relevant history. Preventive care and chronic disease status reviewed.  Outside labs reviewed and will be abstracted. Portions of historical chart may remain incomplete; will update on an ongoing basis as clinically indicated.  As the patient's primary care physician, board-certified in Family Medicine and Obesity Medicine, I am  providing ongoing, comprehensive obesity care based on the pillars of obesity medicine, including nutrition therapy, physical activity, behavioral modification, and pharmacologic treatment.      [1]  Outpatient Medications Prior to Visit  Medication Sig   aspirin EC 81 MG tablet  Take 81 mg by mouth daily. Swallow whole.   Estradiol  10 MCG TABS vaginal tablet Place 1 tablet vaginally at bedtime.   folic acid  (FOLVITE ) 400 MCG tablet Take 400 mcg by mouth daily.   methotrexate (RHEUMATREX) 2.5 MG tablet Take 15 mg by mouth every Wednesday.   tirzepatide  (ZEPBOUND ) 15 MG/0.5ML Pen Inject 15 mg into the skin once a week.   [DISCONTINUED] ALPRAZolam  (XANAX ) 0.25 MG tablet Take 1 tablet (0.25 mg total) by mouth daily as needed for anxiety.   [DISCONTINUED] tirzepatide  (ZEPBOUND ) 5 MG/0.5ML Pen Inject 5 mg into the skin once a week.   predniSONE (DELTASONE) 10 MG tablet Take 1 tablet by mouth daily.   [DISCONTINUED] tirzepatide  (ZEPBOUND ) 7.5 MG/0.5ML Pen Inject 7.5 mg into the skin once a week.   No facility-administered medications prior to visit.  [2]  Allergies Allergen Reactions   Ciprofloxacin Other (See Comments)    Severe joint pain   Tetracyclines & Related Other (See Comments)    Bad headaches   Sulfa Antibiotics Rash and Other (See Comments)    Bad rash   "

## 2024-05-12 ENCOUNTER — Encounter

## 2024-06-14 ENCOUNTER — Ambulatory Visit: Admitting: Family Medicine
